# Patient Record
Sex: Male | Born: 1965 | Race: Black or African American | Hispanic: No | Marital: Married | Smoking: Never smoker
Health system: Southern US, Community
[De-identification: ages and names within clinical notes are randomized; demographics above are authoritative.]

## PROBLEM LIST (undated history)

## (undated) DIAGNOSIS — D571 Sickle-cell disease without crisis: Secondary | ICD-10-CM

## (undated) HISTORY — DX: Sickle-cell disease without crisis: D57.1

## (undated) HISTORY — PX: HERNIA REPAIR: SHX51

## (undated) HISTORY — PX: EYE SURGERY: SHX253

---

## 1998-01-11 ENCOUNTER — Emergency Department (HOSPITAL_COMMUNITY): Admission: EM | Admit: 1998-01-11 | Discharge: 1998-01-11 | Payer: Self-pay | Admitting: Emergency Medicine

## 1998-05-03 ENCOUNTER — Encounter: Admission: RE | Admit: 1998-05-03 | Discharge: 1998-08-01 | Payer: Self-pay | Admitting: Pulmonary Disease

## 1999-11-05 ENCOUNTER — Inpatient Hospital Stay (HOSPITAL_COMMUNITY): Admission: EM | Admit: 1999-11-05 | Discharge: 1999-11-07 | Payer: Self-pay | Admitting: Emergency Medicine

## 2000-01-14 ENCOUNTER — Inpatient Hospital Stay (HOSPITAL_COMMUNITY): Admission: EM | Admit: 2000-01-14 | Discharge: 2000-01-16 | Payer: Self-pay | Admitting: Emergency Medicine

## 2001-08-02 ENCOUNTER — Inpatient Hospital Stay (HOSPITAL_COMMUNITY): Admission: EM | Admit: 2001-08-02 | Discharge: 2001-08-03 | Payer: Self-pay | Admitting: Emergency Medicine

## 2003-04-26 ENCOUNTER — Encounter: Payer: Self-pay | Admitting: Emergency Medicine

## 2003-04-26 ENCOUNTER — Inpatient Hospital Stay (HOSPITAL_COMMUNITY): Admission: EM | Admit: 2003-04-26 | Discharge: 2003-04-29 | Payer: Self-pay | Admitting: Emergency Medicine

## 2013-11-08 ENCOUNTER — Telehealth: Payer: Self-pay | Admitting: General Practice

## 2013-11-08 NOTE — Telephone Encounter (Signed)
Called patient for information regarding medical records. All contact numbers are disconnected at this time.

## 2014-01-19 ENCOUNTER — Telehealth: Payer: Self-pay | Admitting: General Practice

## 2014-01-19 NOTE — Telephone Encounter (Signed)
Attempted to contact patient to schedule appointment. All contact numbers listed are disconnected. [facility] with possible alternate numbers.

## 2014-03-22 ENCOUNTER — Telehealth: Payer: Self-pay | Admitting: Hematology & Oncology

## 2014-03-22 NOTE — Telephone Encounter (Signed)
Pt left message cx [phone]. I left him message to call and reschedule

## 2014-03-24 ENCOUNTER — Ambulatory Visit: Payer: Self-pay | Admitting: Hematology & Oncology

## 2014-03-24 ENCOUNTER — Ambulatory Visit: Payer: Self-pay

## 2014-03-24 ENCOUNTER — Other Ambulatory Visit: Payer: Self-pay | Admitting: Lab

## 2014-03-30 ENCOUNTER — Ambulatory Visit (INDEPENDENT_AMBULATORY_CARE_PROVIDER_SITE_OTHER): Payer: Medicaid Other | Admitting: Internal Medicine

## 2014-03-30 ENCOUNTER — Encounter: Payer: Self-pay | Admitting: Internal Medicine

## 2014-03-30 VITALS — BP 127/68 | HR 70 | Temp 98.3°F | Resp 16 | Ht 67.0 in | Wt 216.0 lb

## 2014-03-30 DIAGNOSIS — Z23 Encounter for immunization: Secondary | ICD-10-CM | POA: Insufficient documentation

## 2014-03-30 DIAGNOSIS — Z1211 Encounter for screening for malignant neoplasm of colon: Secondary | ICD-10-CM

## 2014-03-30 DIAGNOSIS — H5440 Blindness, one eye, unspecified eye: Secondary | ICD-10-CM | POA: Insufficient documentation

## 2014-03-30 DIAGNOSIS — Z0000 Encounter for general adult medical examination without abnormal findings: Secondary | ICD-10-CM | POA: Insufficient documentation

## 2014-03-30 DIAGNOSIS — R011 Cardiac murmur, unspecified: Secondary | ICD-10-CM

## 2014-03-30 DIAGNOSIS — D5720 Sickle-cell/Hb-C disease without crisis: Secondary | ICD-10-CM

## 2014-03-30 LAB — MAGNESIUM: Magnesium: 1.9 mg/dL (ref 1.5–2.5)

## 2014-03-30 LAB — LIPID PANEL
CHOLESTEROL: 145 mg/dL (ref 0–200)
HDL: 32 mg/dL — ABNORMAL LOW (ref >39)
LDL CALC: 95 mg/dL (ref 0–99)
Total CHOL/HDL Ratio: 4.5 Ratio
Triglycerides: 89 mg/dL (ref <150)
VLDL: 18 mg/dL (ref 0–40)

## 2014-03-30 MED ORDER — MORPHINE SULFATE 15 MG PO TABS: 15.0000 mg | ORAL_TABLET | Freq: Four times a day (QID) | ORAL | Status: DC | PRN

## 2014-03-30 NOTE — Progress Notes (Signed)
Patient ID: [name], male   DOB: [birth year], 48 y.o.   MRN: [id number]   Grant Jones, is a 48 y.o. male  ZDG:[id number]  [id number]  DOB - 1965-12-20  CC:  Chief Complaint  Patient presents with  . Establish Care       HPI: Grant Jones is a 48 y.o. male with Hb Morris who is here today to establish medical care. He reports Hb Leonard and had previouly had care provided by [facility] but has not been seen by them in 7 years. He has been having increased pain in the BLE's  R>L. He describes the pain as sharp, non-radiating and sometimes throbbing. This is typical of the pain of vaso-occlusive episodes. The pain has an intensity of 5/10 and has been affecting his ability to work lately. He has no other associated symptoms.  Patient has No headache, No chest pain, No abdominal pain - No Nausea, No new weakness tingling or numbness, No Cough - SOB.  Allergies  Allergen Reactions  . Codeine Swelling  . Penicillins    Past Medical History  Diagnosis Date  . Sickle cell anemia    No current outpatient prescriptions on file prior to visit.   No current facility-administered medications on file prior to visit.   Family History  Problem Relation Age of Onset  . Cancer Mother   . Hypertension Mother   . Cancer Father   . Hypertension Father   . Stroke Father   . Arthritis Father    History   Social History  . Marital Status: Married    Spouse Name: N/A    Number of Children: N/A  . Years of Education: N/A   Occupational History  . Not on file.   Social History Main Topics  . Smoking status: Never Smoker   . Smokeless tobacco: Never Used  . Alcohol Use: No  . Drug Use: No  . Sexual Activity: No   Other Topics Concern  . Not on file   Social History Narrative  . No narrative on file    Review of Systems: Constitutional: Negative for fever, chills, diaphoresis, activity change, appetite change and fatigue. HENT: Negative for ear pain, nosebleeds, congestion,  facial swelling, rhinorrhea, neck pain, neck stiffness and ear discharge.  Eyes: Negative for pain, discharge, redness, itching and visual disturbance. Respiratory: Negative for cough, choking, chest tightness, shortness of breath, wheezing and stridor.  Cardiovascular: Negative for chest pain, palpitations and leg swelling. Gastrointestinal: Negative for abdominal distention. Genitourinary: Negative for dysuria, urgency, frequency, hematuria, flank pain, decreased urine volume, difficulty urinating and dyspareunia.  Musculoskeletal: Negative for back pain, joint swelling and gait problem. Neurological: Negative for dizziness, tremors, seizures, syncope, facial asymmetry, speech difficulty, weakness, light-headedness, numbness and headaches.  Hematological: Negative for adenopathy. Does not bruise/bleed easily. Psychiatric/Behavioral: Negative for hallucinations, behavioral problems, confusion, dysphoric mood, decreased concentration and agitation.    Objective:     Filed Vitals:   03/30/14 0941  BP: 127/68  Pulse: 70  Temp: 98.3 F (36.8 C)  Resp: 16    Physical Exam: Constitutional: Patient appears well-developed and well-nourished. No distress. HENT: Normocephalic, atraumatic, External right and left ear normal. Oropharynx is clear and moist.  Eyes: Conjunctivae and EOM are normal. PERRLA, no scleral icterus. Neck: Normal ROM. Neck supple. No JVD. No tracheal deviation. No thyromegaly. CVS: RRR, S1/S2 +, II/VI SEM at base. Non-radiating. No gallops, no carotid bruit.  Pulmonary: Effort and breath sounds normal, no stridor, rhonchi, wheezes, rales.  Abdominal:  Soft. BS +, no distension, tenderness, rebound or guarding.  Musculoskeletal: Normal range of motion. No edema and no tenderness.  Lymphadenopathy: No lymphadenopathy noted, cervical, inguinal or axillary Neuro: Alert. Normal reflexes, muscle tone coordination. No cranial nerve deficit. Skin: Skin is warm and dry. No rash  noted. Not diaphoretic. No erythema. No pallor. Psychiatric: Normal mood and affect. Behavior, judgment, thought content normal.  No results found for this basename: WBC, HGB, HCT, MCV, PLT   No results found for this basename: CREATININE, BUN, NA, K, CL, CO2    No results found for this basename: HGBA1C   Lipid Panel  No results found for this basename: chol, trig, hdl, cholhdl, vldl, ldlcalc       Assessment and plan:   1. Sickle cell disease, type [place], without crisis Pt here to establish care. He has never taken Hydrea but has continued on OTC Folic Acid. He has been essentially opiate niave in the last several years and has used alternative methods of pain control including OTC non-opiate medications, meditation and heat modalities. However, lately the pain has not been controlled with those modalities.   I have discussed the use of Hydrea. I will check labs today and if okay will initiate Hydrea. Continue Folic acid.   Pt has allergy to codeine which includes airway swelling. Will initiate intermittent analgesic therapy with Morphine Sulfate UR.  - Hemoglobinopathy evaluation - CBC with Differential - Comprehensive metabolic panel - Magnesium - Reticulocytes - Urinalysis - 2D Echocardiogram with contrast; Future - Vit D  25 hydroxy (rtn osteoporosis monitoring) - morphine (MSIR) 15 MG tablet; Take 1 tablet (15 mg total) by mouth every 6 (six) hours as needed for severe pain.  Dispense: 60 tablet; Refill: 0  2. Visit for annual health examination - Pt to schedule a visit for Annual Physical Examination as he has not been seen in more than 5 years by a Physician - Lipid panel  3. 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 Need for Tdap vaccination - TDAP given today. Pt unsure if he had a Pneumonia vaccine. He will check and he will receive the vaccine on next  visit if no record of vaccine. - Tdap vaccine greater than or equal to 7yo IM  5. Special screening for malignant neoplasms, colon - Pt age 48 and will be referred for screening colonoscopy - Ambulatory referral to Gastroenterology  6. Heart murmur - Pt has a II/VI SEM at base. Will refer for ECHO. - 2D Echocardiogram with contrast; Future - TSH   Return for Hb [place], Annual Physical, Review of lab data, heart murmur.  The patient was given clear instructions to go to ER or return to medical center if symptoms don't improve, worsen or new problems develop. The patient verbalized understanding. The patient was told to call to get lab results if they haven't heard anything in the next week.     This note has been created with Education officer, environmentalDragon speech recognition software and smart phrase technology. Any transcriptional errors are unintentional.    Tarhonda Hollenberg A., MD [facility] [place], [place] [address] 079 [phone]   03/30/2014, 10:35 AM

## 2014-03-31 LAB — CBC WITH DIFFERENTIAL/PLATELET
BASOS ABS: 0.1 10*3/uL (ref 0.0–0.1)
Basophils Relative: 1 % (ref 0–1)
EOS ABS: 0.1 10*3/uL (ref 0.0–0.7)
EOS PCT: 2 % (ref 0–5)
HCT: 32.6 % — ABNORMAL LOW (ref 39.0–52.0)
HEMOGLOBIN: 10.9 g/dL — AB (ref 13.0–17.0)
LYMPHS PCT: 30 % (ref 12–46)
Lymphs Abs: 1.5 10*3/uL (ref 0.7–4.0)
MCH: 24.7 pg — AB (ref 26.0–34.0)
MCHC: 33.4 g/dL (ref 30.0–36.0)
MCV: 73.9 fL — ABNORMAL LOW (ref 78.0–100.0)
MONO ABS: 0.3 10*3/uL (ref 0.1–1.0)
Monocytes Relative: 5 % (ref 3–12)
NEUTROS ABS: 3.1 10*3/uL (ref 1.7–7.7)
Neutrophils Relative %: 62 % (ref 43–77)
PLATELETS: 144 10*3/uL — AB (ref 150–400)
RBC: 4.41 MIL/uL (ref 4.22–5.81)
RDW: 21.9 % — ABNORMAL HIGH (ref 11.5–15.5)
WBC: 5 10*3/uL (ref 4.0–10.5)

## 2014-03-31 LAB — HEMOGLOBINOPATHY EVALUATION
Hemoglobin Other: 41.7 % — ABNORMAL HIGH
Hgb A2 Quant: 0 % — ABNORMAL LOW (ref 2.2–3.2)
Hgb A: 0 % — ABNORMAL LOW (ref 96.8–97.8)
Hgb F Quant: 0.9 % (ref 0.0–2.0)
Hgb S Quant: 57.4 % — ABNORMAL HIGH

## 2014-03-31 LAB — RETICULOCYTES
ABS Retic: 123.5 10*3/uL (ref 19.0–186.0)
RBC.: 4.41 MIL/uL (ref 4.22–5.81)
RETIC CT PCT: 2.8 % — AB (ref 0.4–2.3)

## 2014-03-31 LAB — URINALYSIS
BILIRUBIN URINE: NEGATIVE
Glucose, UA: NEGATIVE mg/dL
Hgb urine dipstick: NEGATIVE
Leukocytes, UA: NEGATIVE
Nitrite: NEGATIVE
Protein, ur: NEGATIVE mg/dL
Specific Gravity, Urine: 1.013 (ref 1.005–1.030)
Urobilinogen, UA: >8 mg/dL — ABNORMAL HIGH (ref 0.0–1.0)
pH: 7 (ref 5.0–8.0)

## 2014-03-31 LAB — TSH: TSH: 1.999 u[IU]/mL (ref 0.350–4.500)

## 2014-03-31 LAB — VITAMIN D 25 HYDROXY (VIT D DEFICIENCY, FRACTURES): Vit D, 25-Hydroxy: 15 ng/mL — ABNORMAL LOW (ref 30–89)

## 2014-04-03 ENCOUNTER — Encounter: Payer: Self-pay | Admitting: Gastroenterology

## 2014-04-05 LAB — HGB ELECTROPHORESIS REFLEXED REPORT
HEMOGLOBIN ELECT C: 44.8 % — AB
Hemoglobin A - HGBRFX: 0 % — ABNORMAL LOW (ref >96.0)
Hemoglobin A2 - HGBRFX: 4 % — ABNORMAL HIGH (ref 1.8–3.5)
Hemoglobin F - HGBRFX: 0 % (ref <2.0)
Hemoglobin S - HGBRFX: 51.2 % — ABNORMAL HIGH
SICKLE SOLUBILITY TEST - HGBRFX: POSITIVE — AB

## 2014-04-17 ENCOUNTER — Emergency Department (HOSPITAL_COMMUNITY)
Admission: EM | Admit: 2014-04-17 | Discharge: 2014-04-17 | Disposition: A | Discharge status: Home / Self Care | Payer: Medicaid Other | Attending: Emergency Medicine | Admitting: Emergency Medicine

## 2014-04-17 ENCOUNTER — Encounter (HOSPITAL_COMMUNITY): Payer: Self-pay | Admitting: Emergency Medicine

## 2014-04-17 ENCOUNTER — Emergency Department (HOSPITAL_COMMUNITY): Payer: Medicaid Other

## 2014-04-17 DIAGNOSIS — Z791 Long term (current) use of non-steroidal anti-inflammatories (NSAID): Secondary | ICD-10-CM | POA: Diagnosis not present

## 2014-04-17 DIAGNOSIS — Z79899 Other long term (current) drug therapy: Secondary | ICD-10-CM | POA: Diagnosis not present

## 2014-04-17 DIAGNOSIS — K044 Acute apical periodontitis of pulpal origin: Secondary | ICD-10-CM | POA: Diagnosis not present

## 2014-04-17 DIAGNOSIS — Z880 Allergy status to penicillin: Secondary | ICD-10-CM | POA: Diagnosis not present

## 2014-04-17 DIAGNOSIS — K047 Periapical abscess without sinus: Secondary | ICD-10-CM

## 2014-04-17 DIAGNOSIS — D5700 Hb-SS disease with crisis, unspecified: Secondary | ICD-10-CM | POA: Insufficient documentation

## 2014-04-17 LAB — CBC WITH DIFFERENTIAL/PLATELET
BASOS ABS: 0 10*3/uL (ref 0.0–0.1)
Basophils Relative: 0 % (ref 0–1)
EOS ABS: 0.6 10*3/uL (ref 0.0–0.7)
Eosinophils Relative: 5 % (ref 0–5)
HCT: 31 % — ABNORMAL LOW (ref 39.0–52.0)
Hemoglobin: 10.5 g/dL — ABNORMAL LOW (ref 13.0–17.0)
LYMPHS PCT: 15 % (ref 12–46)
Lymphs Abs: 1.8 10*3/uL (ref 0.7–4.0)
MCH: 25 pg — ABNORMAL LOW (ref 26.0–34.0)
MCHC: 33.9 g/dL (ref 30.0–36.0)
MCV: 73.8 fL — ABNORMAL LOW (ref 78.0–100.0)
MONOS PCT: 4 % (ref 3–12)
Monocytes Absolute: 0.5 10*3/uL (ref 0.1–1.0)
Neutro Abs: 9 10*3/uL — ABNORMAL HIGH (ref 1.7–7.7)
Neutrophils Relative %: 76 % (ref 43–77)
PLATELETS: 139 10*3/uL — AB (ref 150–400)
RBC: 4.2 MIL/uL — ABNORMAL LOW (ref 4.22–5.81)
RDW: 22.4 % — AB (ref 11.5–15.5)
WBC: 11.9 10*3/uL — AB (ref 4.0–10.5)

## 2014-04-17 LAB — COMPREHENSIVE METABOLIC PANEL
ALBUMIN: 4 g/dL (ref 3.5–5.2)
ALK PHOS: 76 U/L (ref 39–117)
ALT: 17 U/L (ref 0–53)
ANION GAP: 17 — AB (ref 5–15)
AST: 25 U/L (ref 0–37)
BUN: 4 mg/dL — AB (ref 6–23)
CO2: 20 mEq/L (ref 19–32)
CREATININE: 0.78 mg/dL (ref 0.50–1.35)
Calcium: 9.4 mg/dL (ref 8.4–10.5)
Chloride: 105 mEq/L (ref 96–112)
GFR calc non Af Amer: >90 mL/min (ref >90)
GLUCOSE: 98 mg/dL (ref 70–99)
POTASSIUM: 3.8 meq/L (ref 3.7–5.3)
Sodium: 142 mEq/L (ref 137–147)
Total Bilirubin: 0.5 mg/dL (ref 0.3–1.2)
Total Protein: 8 g/dL (ref 6.0–8.3)

## 2014-04-17 LAB — RETICULOCYTES
RBC.: 4.2 MIL/uL — AB (ref 4.22–5.81)
Retic Count, Absolute: 172.2 10*3/uL (ref 19.0–186.0)
Retic Ct Pct: 4.1 % — ABNORMAL HIGH (ref 0.4–3.1)

## 2014-04-17 MED ORDER — CLINDAMYCIN HCL 300 MG PO CAPS: 300.0000 mg | ORAL_CAPSULE | Freq: Three times a day (TID) | ORAL | Status: DC

## 2014-04-17 MED ORDER — CLINDAMYCIN PHOSPHATE 600 MG/50ML IV SOLN
600.0000 mg | Freq: Once | INTRAVENOUS | Status: AC
  Administered 2014-04-17: 600 mg via INTRAVENOUS
  Filled 2014-04-17: qty 50

## 2014-04-17 MED ORDER — OXYCODONE-ACETAMINOPHEN 7.5-325 MG PO TABS: 1.0000 | ORAL_TABLET | ORAL | Status: DC | PRN

## 2014-04-17 MED ORDER — SODIUM CHLORIDE 0.9 % IV SOLN
1000.0000 mL | Freq: Once | INTRAVENOUS | Status: AC
Start: 2014-04-17 — End: 2014-04-17
  Administered 2014-04-17: 1000 mL via INTRAVENOUS

## 2014-04-17 MED ORDER — ONDANSETRON HCL 4 MG/2ML IJ SOLN
4.0000 mg | INTRAMUSCULAR | Status: DC | PRN
  Administered 2014-04-17: 4 mg via INTRAVENOUS
  Filled 2014-04-17: qty 2

## 2014-04-17 MED ORDER — HYDROMORPHONE HCL PF 1 MG/ML IJ SOLN
1.0000 mg | INTRAMUSCULAR | Status: AC | PRN
  Administered 2014-04-17 (×2): 1 mg via INTRAVENOUS
  Filled 2014-04-17 (×2): qty 1

## 2014-04-17 MED ORDER — DIPHENHYDRAMINE HCL 50 MG/ML IJ SOLN
12.5000 mg | INTRAMUSCULAR | Status: DC | PRN
  Administered 2014-04-17: 12.5 mg via INTRAVENOUS
  Filled 2014-04-17: qty 1

## 2014-04-17 NOTE — ED Notes (Signed)
Pt in [facility], c/o pain in legs, head, and back of neck.

## 2014-04-17 NOTE — Discharge Instructions (Signed)
Dental Abscess A dental abscess is a collection of infected fluid (pus) from a bacterial infection in the inner part of the tooth (pulp). It usually occurs at the end of the tooth's root.  CAUSES   Severe tooth decay.  Trauma to the tooth that allows bacteria to enter into the pulp, such as a broken or chipped tooth. SYMPTOMS   Severe pain in and around the infected tooth.  Swelling and redness around the abscessed tooth or in the mouth or face.  Tenderness.  Pus drainage.  Bad breath.  Bitter taste in the mouth.  Difficulty swallowing.  Difficulty opening the mouth.  Nausea.  Vomiting.  Chills.  Swollen neck glands. DIAGNOSIS   A medical and dental history will be taken.  An examination will be performed by tapping on the abscessed tooth.  X-rays may be taken of the tooth to identify the abscess. TREATMENT The goal of treatment is to eliminate the infection. You may be prescribed antibiotic medicine to stop the infection from spreading. A root canal may be performed to save the tooth. If the tooth cannot be saved, it may be pulled (extracted) and the abscess may be drained.  HOME CARE INSTRUCTIONS  Only take over-the-counter or prescription medicines for pain, fever, or discomfort as directed by your caregiver.  Rinse your mouth (gargle) often with salt water ( tsp salt in 8 oz [250 ml] of warm water) to relieve pain or swelling.  Do not drive after taking pain medicine (narcotics).  Do not apply heat to the outside of your face.  Return to your dentist for further treatment as directed. SEEK MEDICAL CARE IF:  Your pain is not helped by medicine.  Your pain is getting worse instead of better. SEEK IMMEDIATE MEDICAL CARE IF:  You have a fever or persistent symptoms for more than 2-3 days.  You have a fever and your symptoms suddenly get worse.  You have chills or a very bad headache.  You have problems breathing or swallowing.  You have trouble  opening your mouth.  You have swelling in the neck or around the eye. Document Released: 08/18/2005 Document Revised: 05/12/2012 Document Reviewed: 11/26/2010 [facility] Patient Information 2015 [place]. This information is not intended to replace advice given to you by your health care provider. Make sure you discuss any questions you have with your health care provider.  Sickle Cell Anemia Sickle cell anemia is a condition where your red blood cells are shaped like sickles. Red blood cells carry oxygen through the body. Sickle-shaped red blood cells do not live as long as normal red blood cells. They also clump together and block blood from flowing through the blood vessels. These things prevent the body from getting enough oxygen. Sickle cell anemia causes organ damage and pain. It also increases the risk of infection. HOME CARE  Drink enough fluid to keep your pee (urine) clear or pale yellow. Drink more in hot weather and during exercise.  Do not smoke. Smoking lowers oxygen levels in the blood.  Only take over-the-counter or prescription medicines as told by your doctor.  Take antibiotic medicines as told by your doctor. Make sure you finish them even if you start to feel better.  Take supplements as told by your doctor.  Consider wearing a medical alert bracelet. This tells anyone caring for you in an emergency of your condition.  When traveling, keep your medical information, doctors' names, and the medicines you take with you at all times.  If  you have a fever, do not take fever medicines right away. This could cover up a problem. Tell your doctor.   Keep all follow-up visits with your doctor. Sickle cell anemia requires regular medical care. GET HELP IF: You have a fever. GET HELP RIGHT AWAY IF:  You feel dizzy or faint.  You have new belly (abdominal) pain, especially on the left side near the stomach area.  You have a lasting, often uncomfortable and painful  erection of the penis (priapism). If it is not treated right away, you will become unable to have sex (impotence).  You have numbness in your arms or legs or you have a hard time moving them.  You have a hard time talking.  You have a fever or lasting symptoms for more than 2-3 days.  You have a fever and your symptoms suddenly get worse.  You have signs or symptoms of infection. These include:  Chills.  Being more tired than normal (lethargy).  Irritability.  Poor eating.  Throwing up (vomiting).  You have pain that is not helped with medicine.  You have shortness of breath.  You have pain in your chest.  You are coughing up pus-like or bloody mucus.  You have a stiff neck.  Your feet or hands swell or have pain.  Your belly looks bloated.  Your joints hurt. MAKE SURE YOU:  Understand these instructions.  Will watch your condition.  Will get help right away if you are not doing well or get worse. Document Released: 06/08/2013 Document Revised: 01/02/2014 Document Reviewed: 06/08/2013 [facility] Patient Information 2015 [place], [place]. This information is not intended to replace advice given to you by your health care provider. Make sure you discuss any questions you have with your health care provider.

## 2014-04-17 NOTE — ED Notes (Signed)
patient did not want additional pain med when offered.

## 2014-04-17 NOTE — ED Provider Notes (Signed)
CSN: [id number]     Arrival date & time 04/17/14  0910 History   First MD Initiated Contact with Patient 04/17/14 0930     Chief Complaint  Patient presents with  . Sickle Cell Pain Crisis      HPI Patient with known history of sickle cell disease comes in with sickle cell pain in legs back neck arms.  Patient denies fever chills cough nausea or vomiting.  Patient normally takes over-the-counter medicines but they're not working.  Used to go to do for sickle cell now goes to Dr. Ashley RoyaltyMatthews. Past Medical History  Diagnosis Date  . Sickle cell anemia    Past Surgical History  Procedure Laterality Date  . Eye surgery    . Hernia repair     Family History  Problem Relation Age of Onset  . Cancer Mother   . Hypertension Mother   . Cancer Father   . Hypertension Father   . Stroke Father   . Arthritis Father    History  Substance Use Topics  . Smoking status: Never Smoker   . Smokeless tobacco: Never Used  . Alcohol Use: No    Review of Systems  All other systems reviewed and are negative  Allergies  Codeine and Penicillins  Home Medications   Prior to Admission medications   Medication Sig Start Date End Date Taking? Authorizing Provider  acetaminophen (TYLENOL) 500 MG tablet Take 1,000 mg by mouth every 6 (six) hours as needed for moderate pain.   Yes Historical Provider, MD  folic acid (FOLVITE) 800 MCG tablet Take 800 mcg by mouth daily.   Yes Historical Provider, MD  ibuprofen (ADVIL,MOTRIN) 200 MG tablet Take 800 mg by mouth every 6 (six) hours as needed for moderate pain.   Yes Historical Provider, MD  Menthol, Topical Analgesic, (ICY HOT EX) Apply 1 application topically daily.   Yes Historical Provider, MD  clindamycin (CLEOCIN) 300 MG capsule Take 1 capsule (300 mg total) by mouth 3 (three) times daily. 04/17/14   Nelia Shiobert L Sumner Kirchman, MD  morphine (MSIR) 15 MG tablet Take 1 tablet (15 mg total) by mouth every 6 (six) hours as needed for severe pain. 03/30/14   Altha HarmMichelle  A Matthews, MD  oxyCODONE-acetaminophen (PERCOCET) 7.5-325 MG per tablet Take 1 tablet by mouth every 4 (four) hours as needed for pain. 04/17/14   Nelia Shiobert L Sarabi Sockwell, MD   BP 130/64  Pulse 79  Temp(Src) 98.4 F (36.9 C) (Oral)  Resp 16  SpO2 98% Physical Exam Physical Exam  Nursing note and vitals reviewed. Constitutional: He is oriented to person, place, and time. He appears well-developed and well-nourished. No distress.  HENT:  Head: Normocephalic and atraumatic.  Eyes: Pupils are equal, round, and reactive to light.  Neck: Normal range of motion.  Cardiovascular: Normal rate and intact distal pulses.   Pulmonary/Chest: No respiratory distress.  Abdominal: Normal appearance. He exhibits no distension.  Musculoskeletal: Normal range of motion.  Neurological: He is alert and oriented to person, place, and time. No cranial nerve deficit.  Skin: Skin is warm and dry. No rash noted.  Psychiatric: He has a normal mood and affect. His behavior is normal.   ED Course  Procedures (including critical care time)  Medications  diphenhydrAMINE (BENADRYL) injection 12.5 mg (12.5 mg Intravenous Given 04/17/14 1057)  ondansetron (ZOFRAN) injection 4 mg (4 mg Intravenous Given 04/17/14 1055)  0.9 %  sodium chloride infusion (0 mLs Intravenous Stopped 04/17/14 1226)  HYDROmorphone (DILAUDID) injection 1 mg (1  mg Intravenous Given 04/17/14 1225)  clindamycin (CLEOCIN) IVPB 600 mg (0 mg Intravenous Stopped 04/17/14 1222)    Labs Review Labs Reviewed  CBC WITH DIFFERENTIAL - Abnormal; Notable for the following:    WBC 11.9 (*)    RBC 4.20 (*)    Hemoglobin 10.5 (*)    HCT 31.0 (*)    MCV 73.8 (*)    MCH 25.0 (*)    RDW 22.4 (*)    Platelets 139 (*)    Neutro Abs 9.0 (*)    All other components within normal limits  RETICULOCYTES - Abnormal; Notable for the following:    Retic Ct Pct 4.1 (*)    RBC. 4.20 (*)    All other components within normal limits  COMPREHENSIVE METABOLIC PANEL -  Abnormal; Notable for the following:    BUN 4 (*)    Anion gap 17 (*)    All other components within normal limits    Imaging Review Dg Chest 2 View  04/17/2014   CLINICAL DATA:  Sickle cell crisis.  Pain.  EXAM: CHEST  2 VIEW  COMPARISON:  None.  FINDINGS: Low lung volumes. Normal cardiomediastinal silhouette. No infiltrates or failure. No effusion or pneumothorax. Vertebral body morphology and osseous density are consistent with the diagnosis of sickle cell disease.  IMPRESSION: No active infiltrates.   Electronically Signed   By: Davonna Belling M.D.   On: 04/17/2014 10:36      MDM   Final diagnoses:  Sickle cell crisis  Dental infection        Nelia Shi, MD 04/17/14 [phone]

## 2014-05-01 ENCOUNTER — Ambulatory Visit (INDEPENDENT_AMBULATORY_CARE_PROVIDER_SITE_OTHER): Payer: Medicaid Other | Admitting: Family Medicine

## 2014-05-01 ENCOUNTER — Encounter: Payer: Self-pay | Admitting: Family Medicine

## 2014-05-01 VITALS — BP 128/79 | HR 60 | Temp 98.2°F | Resp 16 | Ht 67.0 in | Wt 222.0 lb

## 2014-05-01 DIAGNOSIS — H5440 Blindness, one eye, unspecified eye: Secondary | ICD-10-CM

## 2014-05-01 DIAGNOSIS — Z23 Encounter for immunization: Secondary | ICD-10-CM

## 2014-05-01 DIAGNOSIS — Z0000 Encounter for general adult medical examination without abnormal findings: Secondary | ICD-10-CM

## 2014-05-01 DIAGNOSIS — E559 Vitamin D deficiency, unspecified: Secondary | ICD-10-CM

## 2014-05-01 DIAGNOSIS — D5720 Sickle-cell/Hb-C disease without crisis: Secondary | ICD-10-CM

## 2014-05-01 DIAGNOSIS — Z8042 Family history of malignant neoplasm of prostate: Secondary | ICD-10-CM

## 2014-05-01 MED ORDER — ERGOCALCIFEROL 1.25 MG (50000 UT) PO CAPS: 50000.0000 [IU] | ORAL_CAPSULE | ORAL | Status: AC

## 2014-05-01 NOTE — Patient Instructions (Signed)
Sickle Cell Anemia, Adult °Sickle cell anemia is a condition in which red blood cells have an abnormal "sickle" shape. This abnormal shape shortens the cells' life span, which results in a lower than normal concentration of red blood cells in the blood. The sickle shape also causes the cells to clump together and block free blood flow through the blood vessels. As a result, the tissues and organs of the body do not receive enough oxygen. Sickle cell anemia causes organ damage and pain and increases the risk of infection. °CAUSES  °Sickle cell anemia is a genetic disorder. Those who receive two copies of the gene have the condition, and those who receive one copy have the trait. °RISK FACTORS °The sickle cell gene is most common in people whose families originated in [place]. Other areas of the globe where sickle cell trait occurs include the Mediterranean, [place] and [place], the [place], and the [place].  °SIGNS AND SYMPTOMS °· Pain, especially in the extremities, back, chest, or abdomen (common). The pain may start suddenly or may develop following an illness, especially if there is dehydration. Pain can also occur due to overexertion or exposure to extreme temperature changes. °· Frequent severe bacterial infections, especially certain types of pneumonia and meningitis. °· Pain and swelling in the hands and feet. °· Decreased activity.   °· Loss of appetite.   °· Change in behavior. °· Headaches. °· Seizures. °· Shortness of breath or difficulty breathing. °· Vision changes. °· Skin ulcers. °Those with the trait may not have symptoms or they may have mild symptoms.  °DIAGNOSIS  °Sickle cell anemia is diagnosed with blood tests that demonstrate the genetic trait. It is often diagnosed during the newborn period, due to mandatory testing nationwide. A variety of blood tests, X-rays, CT scans, MRI scans, ultrasounds, and lung function tests may also be done to monitor the condition. °TREATMENT  °Sickle  cell anemia may be treated with: °· Medicines. You may be given pain medicines, antibiotic medicines (to treat and prevent infections) or medicines to increase the production of certain types of hemoglobin. °· Fluids. °· Oxygen. °· Blood transfusions. °HOME CARE INSTRUCTIONS  °· Drink enough fluid to keep your urine clear or pale yellow. Increase your fluid intake in hot weather and during exercise. °· Do not smoke. Smoking lowers oxygen levels in the blood.   °· Only take over-the-counter or prescription medicines for pain, fever, or discomfort as directed by your health care provider. °· Take antibiotics as directed by your health care provider. Make sure you finish them it even if you start to feel better.   °· Take supplements as directed by your health care provider.   °· Consider wearing a medical alert bracelet. This tells anyone caring for you in an emergency of your condition.   °· When traveling, keep your medical information, health care provider's names, and the medicines you take with you at all times.   °· If you develop a fever, do not take medicines to reduce the fever right away. This could cover up a problem that is developing. Notify your health care provider. °· Keep all follow-up appointments with your health care provider. Sickle cell anemia requires regular medical care. °SEEK MEDICAL CARE IF: ° You have a fever. °SEEK IMMEDIATE MEDICAL CARE IF:  °· You feel dizzy or faint.   °· You have new abdominal pain, especially on the left side near the stomach area.   °· You develop a persistent, often uncomfortable and painful penile erection (priapism). If this is not treated immediately it   will lead to impotence.   °· You have numbness your arms or legs or you have a hard time moving them.   °· You have a hard time with speech.   °· You have a fever or persistent symptoms for more than 2-3 days.   °· You have a fever and your symptoms suddenly get worse.   °· You have signs or symptoms of infection.  These include:   °¨ Chills.   °¨ Abnormal tiredness (lethargy).   °¨ Irritability.   °¨ Poor eating.   °¨ Vomiting.   °· You develop pain that is not helped with medicine.   °· You develop shortness of breath. °· You have pain in your chest.   °· You are coughing up pus-like or bloody sputum.   °· You develop a stiff neck. °· Your feet or hands swell or have pain. °· Your abdomen appears bloated. °· You develop joint pain. °MAKE SURE YOU: °· Understand these instructions. °Document Released: 11/26/2005 Document Revised: 01/02/2014 Document Reviewed: 03/30/2013 °Exit[employer]® Patient Information ©2015 [employer]. This information is not intended to replace advice given to you by your health care provider. Make sure you discuss any questions you have with your health care provider. ° °

## 2014-05-01 NOTE — Progress Notes (Signed)
   Subjective:    Patient ID: Grant Jones, male    DOB: 03-25-1966, 48 y.o.   MRN: [id number]  HPI Patient presents for his annual physical examination. He reports that he feels well and does not have any current complaints. Patient states that he is not taking any dietary substances that are not prescribed by a physician. 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     Past Medical History  Diagnosis Date  . Sickle cell anemia     Review of Systems  Constitutional: Negative.   HENT: Negative.   Eyes: Negative.   Respiratory: Negative.   Cardiovascular: Negative.   Gastrointestinal: Negative.   Endocrine: Negative.   Genitourinary: Negative.   Musculoskeletal: Negative.   Skin: Negative.   Allergic/Immunologic: Negative.   Neurological: Negative.   Hematological: Negative.   Psychiatric/Behavioral: Negative.        Objective:   Physical Exam  Vitals reviewed. Constitutional: He is oriented to person, place, and time. He appears well-developed and well-nourished.  HENT:  Head: Normocephalic.  Right Ear: Hearing, tympanic membrane, external ear and ear canal normal.  Left Ear: Hearing, tympanic membrane, external ear and ear canal normal.  Nose: Nose normal.  Mouth/Throat: Oropharynx is clear and moist. Abnormal dentition. Dental caries present.  Eyes: Conjunctivae and lids are normal. Pupils are equal, round, and reactive to light.    Neck: Normal range of motion. Neck supple.  Cardiovascular: Normal rate, regular rhythm, normal heart sounds and intact distal pulses.   Pulmonary/Chest: Effort normal and breath sounds normal.  Abdominal: Soft. Bowel sounds are normal.  Musculoskeletal: Normal range of motion.  Neurological: He is alert and oriented to person, place, and time. He has normal reflexes.  Skin: Skin is warm and dry.  Psychiatric: He has a normal mood and affect. His behavior is normal. Judgment and thought  content normal.      BP 128/79  Pulse 60  Temp(Src) 98.2 F (36.8 C) (Oral)  Resp 16  Ht  (1.702 m)  Wt 222 lb (100.699 kg)  BMI 34.76 kg/m2  SpO2 99%    Assessment & Plan:  1. 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 Continue folic acid 1 mg daily to prevent aplastic bone marrow crises . States that he takes Ibuprofen for pain as needed. He reports that he drinks a lot of water but does not eat a balanced diet. He reports that he often eats 1 large meal. Recommend that patient eat smaller, balanced meals.  - Ambulatory referral to Ophthalmology  2. Visit for annual health examination -Prostates and occult blood negative.  -Reviewed annual physical lab values. Vitamin D deficiency  - POC Hemoccult Bld/Stl (1-Cd Office Dx)  3. Blindness of left eye -Opthalmology referral, he states that it has been several years since last eye exam.   4. Immunization due  - Flu Vaccine QUAD 36+ mos PF IM (Fluarix Quad PF) - Pneumococcal polysaccharide vaccine 23-valent greater than or equal to 2yo subcutaneous/IM  5. Unspecified vitamin D deficiency - ergocalciferol (VITAMIN D2) 50000 UNITS capsule; Take 1 capsule (50,000 Units total) by mouth once a week.  Dispense: 4 capsule; Refill: 4  6. Family history of prostate cancer in father Check prostate manually. Will also check PSA level.  - PSA   RTC: 3 months for follow up of SCD    Dessiree Sze M, FNP

## 2014-05-02 LAB — PSA: PSA: 1.74 ng/mL (ref <=4.00)

## 2014-06-07 ENCOUNTER — Encounter: Payer: Self-pay | Admitting: Gastroenterology

## 2014-07-20 ENCOUNTER — Non-Acute Institutional Stay (HOSPITAL_COMMUNITY)
Admission: AD | Admit: 2014-07-20 | Discharge: 2014-07-20 | Disposition: A | Discharge status: Home / Self Care | Payer: Medicaid Other | Attending: Internal Medicine | Admitting: Internal Medicine

## 2014-07-20 ENCOUNTER — Telehealth (HOSPITAL_COMMUNITY): Payer: Self-pay | Admitting: *Deleted

## 2014-07-20 ENCOUNTER — Encounter (HOSPITAL_COMMUNITY): Payer: Self-pay | Admitting: *Deleted

## 2014-07-20 DIAGNOSIS — H5440 Blindness, one eye, unspecified eye: Secondary | ICD-10-CM | POA: Diagnosis present

## 2014-07-20 DIAGNOSIS — Z79899 Other long term (current) drug therapy: Secondary | ICD-10-CM | POA: Diagnosis not present

## 2014-07-20 DIAGNOSIS — D57819 Other sickle-cell disorders with crisis, unspecified: Secondary | ICD-10-CM | POA: Insufficient documentation

## 2014-07-20 DIAGNOSIS — Z880 Allergy status to penicillin: Secondary | ICD-10-CM | POA: Insufficient documentation

## 2014-07-20 DIAGNOSIS — D5720 Sickle-cell/Hb-C disease without crisis: Secondary | ICD-10-CM | POA: Diagnosis present

## 2014-07-20 DIAGNOSIS — Z885 Allergy status to narcotic agent status: Secondary | ICD-10-CM | POA: Insufficient documentation

## 2014-07-20 DIAGNOSIS — E860 Dehydration: Secondary | ICD-10-CM | POA: Insufficient documentation

## 2014-07-20 DIAGNOSIS — D5700 Hb-SS disease with crisis, unspecified: Secondary | ICD-10-CM | POA: Diagnosis present

## 2014-07-20 DIAGNOSIS — H5442 Blindness, left eye, normal vision right eye: Secondary | ICD-10-CM

## 2014-07-20 LAB — CBC WITH DIFFERENTIAL/PLATELET
BASOS PCT: 1 % (ref 0–1)
Basophils Absolute: 0 10*3/uL (ref 0.0–0.1)
EOS PCT: 6 % — AB (ref 0–5)
Eosinophils Absolute: 0.3 10*3/uL (ref 0.0–0.7)
HEMATOCRIT: 27.8 % — AB (ref 39.0–52.0)
HEMOGLOBIN: 9.7 g/dL — AB (ref 13.0–17.0)
Lymphocytes Relative: 33 % (ref 12–46)
Lymphs Abs: 1.6 10*3/uL (ref 0.7–4.0)
MCH: 26.1 pg (ref 26.0–34.0)
MCHC: 34.9 g/dL (ref 30.0–36.0)
MCV: 74.9 fL — ABNORMAL LOW (ref 78.0–100.0)
Monocytes Absolute: 0.2 10*3/uL (ref 0.1–1.0)
Monocytes Relative: 4 % (ref 3–12)
NEUTROS PCT: 56 % (ref 43–77)
Neutro Abs: 2.7 10*3/uL (ref 1.7–7.7)
Platelets: 114 10*3/uL — ABNORMAL LOW (ref 150–400)
RBC: 3.71 MIL/uL — AB (ref 4.22–5.81)
RDW: 21.9 % — ABNORMAL HIGH (ref 11.5–15.5)
WBC: 4.8 10*3/uL (ref 4.0–10.5)

## 2014-07-20 LAB — COMPREHENSIVE METABOLIC PANEL
ALBUMIN: 3.8 g/dL (ref 3.5–5.2)
ALT: 11 U/L (ref 0–53)
AST: 15 U/L (ref 0–37)
Alkaline Phosphatase: 76 U/L (ref 39–117)
Anion gap: 14 (ref 5–15)
BUN: 4 mg/dL — ABNORMAL LOW (ref 6–23)
CALCIUM: 8.9 mg/dL (ref 8.4–10.5)
CO2: 24 mEq/L (ref 19–32)
Chloride: 104 mEq/L (ref 96–112)
Creatinine, Ser: 0.79 mg/dL (ref 0.50–1.35)
GFR calc non Af Amer: >90 mL/min (ref >90)
Glucose, Bld: 92 mg/dL (ref 70–99)
Potassium: 3.4 mEq/L — ABNORMAL LOW (ref 3.7–5.3)
Sodium: 142 mEq/L (ref 137–147)
Total Bilirubin: 1.2 mg/dL (ref 0.3–1.2)
Total Protein: 7.3 g/dL (ref 6.0–8.3)

## 2014-07-20 LAB — RETICULOCYTES
RBC.: 3.71 MIL/uL — ABNORMAL LOW (ref 4.22–5.81)
RETIC COUNT ABSOLUTE: 144.7 10*3/uL (ref 19.0–186.0)
Retic Ct Pct: 3.9 % — ABNORMAL HIGH (ref 0.4–3.1)

## 2014-07-20 MED ORDER — ONDANSETRON HCL 4 MG PO TABS: 4.0000 mg | ORAL_TABLET | ORAL | Status: DC | PRN

## 2014-07-20 MED ORDER — SENNOSIDES-DOCUSATE SODIUM 8.6-50 MG PO TABS: 1.0000 | ORAL_TABLET | Freq: Two times a day (BID) | ORAL | Status: DC

## 2014-07-20 MED ORDER — DIPHENHYDRAMINE HCL 50 MG/ML IJ SOLN: 25.0000 mg | INTRAMUSCULAR | Status: DC | PRN

## 2014-07-20 MED ORDER — DEXTROSE-NACL 5-0.45 % IV SOLN
INTRAVENOUS | Status: DC
  Administered 2014-07-20: 11:00:00 via INTRAVENOUS

## 2014-07-20 MED ORDER — OXYCODONE-ACETAMINOPHEN 5-325 MG PO TABS: 1.0000 | ORAL_TABLET | Freq: Four times a day (QID) | ORAL | Status: DC | PRN

## 2014-07-20 MED ORDER — POLYETHYLENE GLYCOL 3350 17 G PO PACK
17.0000 g | PACK | Freq: Every day | ORAL | Status: DC | PRN
  Filled 2014-07-20: qty 1

## 2014-07-20 MED ORDER — SODIUM CHLORIDE 0.9 % IJ SOLN: 9.0000 mL | INTRAMUSCULAR | Status: DC | PRN

## 2014-07-20 MED ORDER — FOLIC ACID 1 MG PO TABS
1.0000 mg | ORAL_TABLET | Freq: Every day | ORAL | Status: DC
  Administered 2014-07-20: 1 mg via ORAL

## 2014-07-20 MED ORDER — NALOXONE HCL 0.4 MG/ML IJ SOLN: 0.4000 mg | INTRAMUSCULAR | Status: DC | PRN

## 2014-07-20 MED ORDER — DIPHENHYDRAMINE HCL 25 MG PO CAPS: 25.0000 mg | ORAL_CAPSULE | ORAL | Status: DC | PRN

## 2014-07-20 MED ORDER — HYDROMORPHONE 2 MG/ML HIGH CONCENTRATION IV PCA SOLN
INTRAVENOUS | Status: DC
  Administered 2014-07-20: 11:00:00 via INTRAVENOUS

## 2014-07-20 MED ORDER — OXYCODONE-ACETAMINOPHEN 5-325 MG PO TABS
1.0000 | ORAL_TABLET | Freq: Once | ORAL | Status: AC
  Administered 2014-07-20: 1 via ORAL
  Filled 2014-07-20: qty 1

## 2014-07-20 MED ORDER — ONDANSETRON HCL 4 MG/2ML IJ SOLN: 4.0000 mg | INTRAMUSCULAR | Status: DC | PRN

## 2014-07-20 MED ORDER — KETOROLAC TROMETHAMINE 30 MG/ML IJ SOLN
30.0000 mg | Freq: Four times a day (QID) | INTRAMUSCULAR | Status: DC
  Administered 2014-07-20 (×2): 30 mg via INTRAVENOUS
  Filled 2014-07-20: qty 1

## 2014-07-20 NOTE — Discharge Summary (Signed)
Physician Discharge Summary  Grant Jones KGM:[id number] DOB: [birth year] DOA: 07/20/2014  PCP: No primary care provider on file.  Admit date: 07/20/2014 Discharge date: 07/20/2014  Discharge Diagnoses:  Active Problems:   Sickle cell disease, type [place]   Blindness of left eye   Sickle cell disease with crisis   Discharge Condition: Stable  Disposition: Home accompanied by cousin  Diet:Regular Wt Readings from Last 3 Encounters:  05/01/14 222 lb (100.699 kg)  03/30/14 216 lb (97.977 kg)    Hospital Course:  [name] was admitted to the day infusion center for extended observation. His treatment plan was outlined by Dr. Mikeal HawthorneGarba. He was started on hypotonic IV fluids for cellular rehydration and Toradol every 6 hours for inflammation. He was started on a high concentration PCA for pain control. Patient is opiate naive, he rarely takes opiate pain medications for pain control. He primarily takes Ibuprofen. He used a total of 3 mg with 5 demands and 5 deliveries. PCA was discontinued due to respiration decrease to 7. Respirations have been averaging from 7-25. His pain intensity decreased to 4/10, he states that he can function at home. He was given an Rx of Percocet 5-325 mg every 6 hours for moderate to severe pain #20. He was previously taking Percocet 7.5-325 mg every 4 hours, he states that it has been months since he has taken that dosage. Also, he did not fill his last prescription. We also discussed the importance of daily folic acid and 64 ounces of water per day.  Patient can continue Ibuprofen as directed. He is scheduled to follow up in the clinic as scheduled.   Discharge Exam:  Filed Vitals:   07/20/14 1455  BP: 128/78  Pulse: 62  Temp:   Resp: 25   Filed Vitals:   07/20/14 1343 07/20/14 1344 07/20/14 1348 07/20/14 1455  BP:   104/61 128/78  Pulse:   55 62  Temp:   97.9 F (36.6 C)   TempSrc:   Oral   Resp: 20 16 12 25   SpO2: 73% 100% 100% 94%   General:  Alert, awake, oriented x3, in no acute distress.  Neck: Trachea midline,  no masses, no thyromegal,y no JVD, no carotid bruit OROPHARYNX:  Moist, No exudate/ erythema/lesions.  Heart: Regular rate and rhythm, without murmurs, rubs, gallops, PMI non-displaced, no heaves or thrills on palpation.  Lungs: Clear to auscultation, no wheezing or rhonchi noted. No increased vocal fremitus resonant to percussion  Abdomen: Soft, nontender, nondistended, positive bowel sounds, no masses no hepatosplenomegaly noted..  Neuro: No focal neurological deficits noted cranial nerves II through XII grossly intact. DTRs 2+ bilaterally upper and lower extremities. Strength 5 out of 5 in bilateral upper and lower extremities. Musculoskeletal: No warm swelling or erythema around joints, no spinal tenderness noted. Psychiatric: Patient alert and oriented x3, good insight and cognition, good recent to remote recall. Lymph node survey: No cervical axillary or inguinal lymphadenopathy noted.   Discharge Instructions     Medication List    STOP taking these medications        oxyCODONE-acetaminophen 7.5-325 MG per tablet  Commonly known as:  PERCOCET  Replaced by:  oxyCODONE-acetaminophen 5-325 MG per tablet      TAKE these medications        acetaminophen 500 MG tablet  Commonly known as:  TYLENOL  Take 1,000 mg by mouth every 6 (six) hours as needed for moderate pain.     ergocalciferol 50000 UNITS capsule  Commonly known  as:  VITAMIN D2  Take 1 capsule (50,000 Units total) by mouth once a week.     folic acid 800 MCG tablet  Commonly known as:  FOLVITE  Take 800 mcg by mouth daily.     ibuprofen 200 MG tablet  Commonly known as:  ADVIL,MOTRIN  Take 800 mg by mouth every 6 (six) hours as needed for moderate pain.     ICY HOT EX  Apply 1 application topically daily.     oxyCODONE-acetaminophen 5-325 MG per tablet  Commonly known as:  PERCOCET  Take 1 tablet by mouth every 6 (six) hours as needed  for severe pain.          The results of significant diagnostics from this hospitalization (including imaging, microbiology, ancillary and laboratory) are listed below for reference.    Significant Diagnostic Studies: No results found.  Microbiology: No results found for this or any previous visit (from the past 240 hour(s)).   Labs: Basic Metabolic Panel:  Recent Labs Lab 07/20/14 1100  NA 142  K 3.4*  CL 104  CO2 24  GLUCOSE 92  BUN 4*  CREATININE 0.79  CALCIUM 8.9   Liver Function Tests:  Recent Labs Lab 07/20/14 1100  AST 15  ALT 11  ALKPHOS 76  BILITOT 1.2  PROT 7.3  ALBUMIN 3.8   No results for input(s): LIPASE, AMYLASE in the last 168 hours. No results for input(s): AMMONIA in the last 168 hours. CBC:  Recent Labs Lab 07/20/14 1100  WBC 4.8  NEUTROABS 2.7  HGB 9.7*  HCT 27.8*  MCV 74.9*  PLT 114*   Cardiac Enzymes: No results for input(s): CKTOTAL, CKMB, CKMBINDEX, TROPONINI in the last 168 hours. BNP: Invalid input(s): POCBNP CBG: No results for input(s): GLUCAP in the last 168 hours. Ferritin: No results for input(s): FERRITIN in the last 168 hours.  Time coordinating discharge: 45 minutes  Signed:  Johndaniel Catlin M  07/20/2014, 2:59 PM

## 2014-07-20 NOTE — H&P (Signed)
Grant Jones is an 48 y.o. male.   Chief Complaint: Pain in back and legs HPI: [name] was known history of sickle cell [place] disease who [facility] complaining of 10 out of 10 pain in his legs especially around the knees. Pain is sharp worsening was movement and not relieved by anything including rest. Patient has not filled any prescription that was given to him for pain. He is therefore most 2-D nave to opiates. He denied any injury to the knees. Patient believes this is his sickle cell crisis. He denied any fever, no cough no shortness of breath no chest pain. Pain is persistent for the last 2 days.  Past Medical History  Diagnosis Date  . Sickle cell anemia     Past Surgical History  Procedure Laterality Date  . Eye surgery    . Hernia repair      Family History  Problem Relation Age of Onset  . Cancer Mother   . Hypertension Mother   . Cancer Father   . Hypertension Father   . Stroke Father   . Arthritis Father    Social History:  reports that he has never smoked. He has never used smokeless tobacco. He reports that he does not drink alcohol or use illicit drugs.  Allergies:  Allergies  Allergen Reactions  . Codeine Swelling  . Penicillins Other (See Comments)    Childhood allergy    Medications Prior to Admission  Medication Sig Dispense Refill  . acetaminophen (TYLENOL) 500 MG tablet Take 1,000 mg by mouth every 6 (six) hours as needed for moderate pain.    . ergocalciferol (VITAMIN D2) 50000 UNITS capsule Take 1 capsule (50,000 Units total) by mouth once a week. 4 capsule 4  . folic acid (FOLVITE) 800 MCG tablet Take 800 mcg by mouth daily.    Marland Kitchen. ibuprofen (ADVIL,MOTRIN) 200 MG tablet Take 800 mg by mouth every 6 (six) hours as needed for moderate pain.    . Menthol, Topical Analgesic, (ICY HOT EX) Apply 1 application topically daily.    Marland Kitchen. oxyCODONE-acetaminophen (PERCOCET) 7.5-325 MG per tablet Take 1 tablet by mouth  every 4 (four) hours as needed for pain. 30 tablet 0    No results found for this or any previous visit (from the past 48 hour(s)). No results found.  Review of Systems  Constitutional: Negative.   HENT: Negative.   Eyes: Positive for blurred vision.       Blind  Respiratory: Negative.   Cardiovascular: Negative.   Gastrointestinal: Negative.   Genitourinary: Negative.   Musculoskeletal: Positive for myalgias, back pain and joint pain.  Skin: Negative.   Neurological: Negative.   Endo/Heme/Allergies: Negative.   Psychiatric/Behavioral: Negative.     Blood pressure 136/73, pulse 80, temperature 99.1 F (37.3 C), temperature source Oral, resp. rate 18, SpO2 100 %. Physical Exam  Constitutional: He appears well-developed and well-nourished.  HENT:  Head: Normocephalic and atraumatic.  Right Ear: External ear normal.  Left Ear: External ear normal.  Mouth/Throat: Oropharynx is clear and moist.  Eyes: Conjunctivae and EOM are normal. Pupils are equal, round, and reactive to light.  Neck: Normal range of motion. Neck supple.  Cardiovascular: Normal rate, regular rhythm, normal heart sounds and intact distal pulses.   Respiratory: Effort normal and breath sounds normal.  GI: Soft. Bowel sounds are normal.  Musculoskeletal: Normal range of motion. He exhibits tenderness.  Neurological: He is alert. He has normal reflexes.  Skin:  Skin is warm and dry.  Psychiatric: He has a normal mood and affect.     Assessment/Plan A 48 YO with sickle cell [place] disease admitted with sickle cell painful crisis.  #1 sickle cell painful crisis: Patient will be admitted to the sickle cell pain center. He will receive IV Dilaudid low-dose on Dilaudid PCA pump was carefully monitored since he is opiates nave. We will given some Toradol and what his response. He'll be counselled on taking his home medications.  #2 sickle cell anemia: His hemoglobin seems stable. We will continue to monitor him through  today.  #3 dehydration: Patient will be hydrated aggressively.  GARBA,LAWAL 07/20/2014, 10:40 AM

## 2014-07-20 NOTE — Progress Notes (Signed)
Reviewed pt allergy with Dr.Garba; ok to give ocycodone; Shekia Kuper, Garnette GunnerIrina,RN

## 2014-07-20 NOTE — Progress Notes (Signed)
Notified md of respiration at 7. md ordered stop pca. Entering additional orders.

## 2014-07-20 NOTE — Progress Notes (Signed)
Pt arrived to a day center with pain 10/10 in "back and down to legs" throbbing, aching pain; A/O; VSS; Dilaudid PCA and fluids started per order; pain goal at 5;  pt resting comfortably in bed; post procedure Pt's pain is at 4; Pt a/o; uses insensive spirometer to promote oxygenation; RR 16; O2 100 on room air; CO2 30; HR 56; eyes open; follows commands mood appropriate; Will have family member assist to get home; [place], Garnette GunnerIrina,RN

## 2014-07-20 NOTE — Telephone Encounter (Unsigned)
Patient complaining of leg pain for several days - pain is currently 10 out 10. No other complications. Informed I would notify MD.

## 2014-07-20 NOTE — Telephone Encounter (Unsigned)
Patient informed that he may visit [facility].

## 2014-08-10 ENCOUNTER — Ambulatory Visit: Payer: Medicaid Other | Admitting: Family Medicine

## 2014-09-07 ENCOUNTER — Ambulatory Visit: Payer: Medicaid Other | Admitting: Family Medicine

## 2015-05-23 ENCOUNTER — Encounter (HOSPITAL_COMMUNITY): Payer: Self-pay

## 2015-05-23 ENCOUNTER — Emergency Department (HOSPITAL_COMMUNITY)
Admission: EM | Admit: 2015-05-23 | Discharge: 2015-05-23 | Disposition: A | Discharge status: Home / Self Care | Payer: Medicaid Other | Attending: Emergency Medicine | Admitting: Emergency Medicine

## 2015-05-23 DIAGNOSIS — Z7982 Long term (current) use of aspirin: Secondary | ICD-10-CM | POA: Diagnosis not present

## 2015-05-23 DIAGNOSIS — Z79899 Other long term (current) drug therapy: Secondary | ICD-10-CM | POA: Insufficient documentation

## 2015-05-23 DIAGNOSIS — Z880 Allergy status to penicillin: Secondary | ICD-10-CM | POA: Diagnosis not present

## 2015-05-23 DIAGNOSIS — D5700 Hb-SS disease with crisis, unspecified: Secondary | ICD-10-CM

## 2015-05-23 LAB — CBC WITH DIFFERENTIAL/PLATELET
BASOS ABS: 0.1 10*3/uL (ref 0.0–0.1)
Basophils Relative: 1 %
EOS ABS: 0.2 10*3/uL (ref 0.0–0.7)
Eosinophils Relative: 3 %
HCT: 32.6 % — ABNORMAL LOW (ref 39.0–52.0)
Hemoglobin: 11.4 g/dL — ABNORMAL LOW (ref 13.0–17.0)
LYMPHS PCT: 33 %
Lymphs Abs: 2.5 10*3/uL (ref 0.7–4.0)
MCH: 26.6 pg (ref 26.0–34.0)
MCHC: 35 g/dL (ref 30.0–36.0)
MCV: 76 fL — ABNORMAL LOW (ref 78.0–100.0)
MONOS PCT: 4 %
Monocytes Absolute: 0.3 10*3/uL (ref 0.1–1.0)
NEUTROS ABS: 4.4 10*3/uL (ref 1.7–7.7)
NEUTROS PCT: 59 %
PLATELETS: 159 10*3/uL (ref 150–400)
RBC: 4.29 MIL/uL (ref 4.22–5.81)
RDW: 21.4 % — ABNORMAL HIGH (ref 11.5–15.5)
WBC: 7.5 10*3/uL (ref 4.0–10.5)

## 2015-05-23 LAB — RETICULOCYTES
RBC.: 4.32 MIL/uL (ref 4.22–5.81)
RETIC CT PCT: 3.8 % — AB (ref 0.4–3.1)
Retic Count, Absolute: 164.2 10*3/uL (ref 19.0–186.0)

## 2015-05-23 LAB — COMPREHENSIVE METABOLIC PANEL
ALK PHOS: 95 U/L (ref 38–126)
ALT: 21 U/L (ref 17–63)
AST: 27 U/L (ref 15–41)
Albumin: 4.8 g/dL (ref 3.5–5.0)
Anion gap: 11 (ref 5–15)
BILIRUBIN TOTAL: 1 mg/dL (ref 0.3–1.2)
BUN: 9 mg/dL (ref 6–20)
CALCIUM: 9.6 mg/dL (ref 8.9–10.3)
CO2: 23 mmol/L (ref 22–32)
Chloride: 108 mmol/L (ref 101–111)
Creatinine, Ser: 0.85 mg/dL (ref 0.61–1.24)
GLUCOSE: 90 mg/dL (ref 65–99)
POTASSIUM: 3.8 mmol/L (ref 3.5–5.1)
Sodium: 142 mmol/L (ref 135–145)
TOTAL PROTEIN: 8.8 g/dL — AB (ref 6.5–8.1)

## 2015-05-23 MED ORDER — OXYCODONE-ACETAMINOPHEN 5-325 MG PO TABS
1.0000 | ORAL_TABLET | ORAL | Status: AC | PRN
Start: 2015-05-23 — End: ?

## 2015-05-23 MED ORDER — HYDROMORPHONE HCL 1 MG/ML IJ SOLN
1.0000 mg | Freq: Once | INTRAMUSCULAR | Status: AC
Start: 2015-05-23 — End: 2015-05-23
  Administered 2015-05-23: 1 mg via INTRAVENOUS
  Filled 2015-05-23: qty 1

## 2015-05-23 MED ORDER — SODIUM CHLORIDE 0.9 % IV BOLUS (SEPSIS)
1000.0000 mL | Freq: Once | INTRAVENOUS | Status: AC
  Administered 2015-05-23: 1000 mL via INTRAVENOUS

## 2015-05-23 MED ORDER — HYDROMORPHONE HCL 1 MG/ML IJ SOLN: 1.0000 mg | Freq: Once | INTRAMUSCULAR | Status: DC

## 2015-05-23 NOTE — Progress Notes (Addendum)
1523 C M spoke with Jacki Cones at [facility] [phone] to confirm pt no longer seen by Dr Ashley Royalty at the clinic Pt last seen in the clinic a yr ago but is still an active pt and can be seen by the new provider Dr Hyman Hopes if pt wishes to be established as a new pt.. Cm informed pt of this and he agrees to contact the clinic using the information provided by CM in d/c instructions     1253 CM attempted to speak with staff at sickle cell clinic but office closed for lunch, re open at 1300 1249 CM noted Sickle cell patient Cm spoke with pt He states he had been followed by Dr Deno Etienne but is presently looking for another provider.  Pt confirms he has been connected with [facility] and has their business card in his wallet.  Pt reports feeling some better He confirms IVFs started, labs obtained and he has already been seen by EDP at this time

## 2015-05-23 NOTE — ED Provider Notes (Signed)
CSN: [id number]     Arrival date & time 05/23/15  1008 History   First MD Initiated Contact with Patient 05/23/15 1211     Chief Complaint  Patient presents with  . Sickle Cell Pain Crisis     (Consider location/radiation/quality/duration/timing/severity/associated sxs/prior Treatment) HPI Comments: Pt comes in with cc of sickle cell pain crisis. Pt reports that he has been having off and on pain the past week. Pt states that whilst at work today, he started having pain all over his body (everywhere except the head). Pain is intense and severe and is throbbing and sharp. Last crisis was a year ago, he has never been hospitalized. No fevers. Pt is unsure of a specific trigger, but he thinks it might be work related stress.  Patient is a 49 y.o. male presenting with sickle cell pain. The history is provided by the patient.  Sickle Cell Pain Crisis Associated symptoms: no chest pain, no cough and no shortness of breath     Past Medical History  Diagnosis Date  . Sickle cell anemia    Past Surgical History  Procedure Laterality Date  . Eye surgery    . Hernia repair     Family History  Problem Relation Age of Onset  . Cancer Mother   . Hypertension Mother   . Cancer Father   . Hypertension Father   . Stroke Father   . Arthritis Father    Social History  Substance Use Topics  . Smoking status: Never Smoker   . Smokeless tobacco: Never Used  . Alcohol Use: No    Review of Systems  Constitutional: Negative for activity change and appetite change.  Respiratory: Negative for cough and shortness of breath.   Cardiovascular: Negative for chest pain.  Gastrointestinal: Negative for abdominal pain.  Genitourinary: Negative for dysuria.  Musculoskeletal: Positive for myalgias and arthralgias.  Skin: Negative for rash.      Allergies  Codeine and Penicillins  Home Medications   Prior to Admission medications   Medication Sig Start Date End Date Taking? Authorizing  Grant Jones  acetaminophen (TYLENOL) 500 MG tablet Take 1,000 mg by mouth every 6 (six) hours as needed for moderate pain.   Yes Historical Athalene Kolle, MD  aspirin 325 MG tablet Take 650 mg by mouth every 8 (eight) hours as needed for mild pain, moderate pain or headache.   Yes Historical Marshaun Lortie, MD  cholecalciferol (VITAMIN D) 1000 UNITS tablet Take 1,000 Units by mouth daily.   Yes Historical Renaye Janicki, MD  Fexofenadine HCl (ALLERGY 24-HR PO) Take 1 tablet by mouth daily as needed (allergies).   Yes Historical Demeshia Sherburne, MD  folic acid (FOLVITE) 800 MCG tablet Take 800 mcg by mouth daily.   Yes Historical Rya Rausch, MD  ibuprofen (ADVIL,MOTRIN) 200 MG tablet Take 800 mg by mouth every 6 (six) hours as needed for moderate pain.   Yes Historical Brynlea Spindler, MD  Menthol, Topical Analgesic, (ICY HOT EX) Apply 1 application topically daily as needed (pain).    Yes Historical Glynna Failla, MD  ergocalciferol (VITAMIN D2) 50000 UNITS capsule Take 1 capsule (50,000 Units total) by mouth once a week. Patient not taking: Reported on 05/23/2015 05/01/14   Massie Maroon, FNP  oxyCODONE-acetaminophen (PERCOCET/ROXICET) 5-325 MG per tablet Take 1 tablet by mouth every 4 (four) hours as needed for severe pain. 05/23/15   Ankit Rhunette Croft, MD   BP 114/63 mmHg  Pulse 78  Temp(Src) 98.4 F (36.9 C) (Oral)  Resp 16  Ht  (1.702 m)  Wt 215 lb 8 oz (97.75 kg)  BMI 33.74 kg/m2  SpO2 98% Physical Exam  Constitutional: He is oriented to person, place, and time. He appears well-developed.  HENT:  Head: Atraumatic.  Neck: Neck supple.  Cardiovascular: Normal rate.   Pulmonary/Chest: Effort normal.  Neurological: He is alert and oriented to person, place, and time.  Skin: Skin is warm.  Nursing note and vitals reviewed.   ED Course  Procedures (including critical care time) Labs Review Labs Reviewed  COMPREHENSIVE METABOLIC PANEL - Abnormal; Notable for the following:    Total Protein 8.8 (*)    All other  components within normal limits  CBC WITH DIFFERENTIAL/PLATELET - Abnormal; Notable for the following:    Hemoglobin 11.4 (*)    HCT 32.6 (*)    MCV 76.0 (*)    RDW 21.4 (*)    All other components within normal limits  RETICULOCYTES - Abnormal; Notable for the following:    Retic Ct Pct 3.8 (*)    All other components within normal limits    Imaging Review No results found. I have personally reviewed and evaluated these images and lab results as part of my medical decision-making.   EKG Interpretation None      MDM   Final diagnoses:  Sickle cell anemia with pain    PT comes in with sickle cell pain. Pain improved with 1st round of medicine. Pain is diffuse and similar to prior sickle cell. Pt to get 2nd round of medicine - if he is better, he will be discharged. Based on 0 SIRs, and non focal exam and no fevers - appears to be pain from sickle cell and not any clinically significant complication.    Derwood Kaplan, MD 05/25/15 1940

## 2015-05-23 NOTE — Discharge Instructions (Signed)
Call the sickle cell clinic as requested.   Sickle Cell Anemia, Adult Sickle cell anemia is a condition in which red blood cells have an abnormal "sickle" shape. This abnormal shape shortens the cells' life span, which results in a lower than normal concentration of red blood cells in the blood. The sickle shape also causes the cells to clump together and block free blood flow through the blood vessels. As a result, the tissues and organs of the body do not receive enough oxygen. Sickle cell anemia causes organ damage and pain and increases the risk of infection. CAUSES  Sickle cell anemia is a genetic disorder. Those who receive two copies of the gene have the condition, and those who receive one copy have the trait. RISK FACTORS The sickle cell gene is most common in people whose families originated in [place]. Other areas of the globe where sickle cell trait occurs include the [place], [place] and [place], the [place], and the [place].  SIGNS AND SYMPTOMS  Pain, especially in the extremities, back, chest, or abdomen (common). The pain may start suddenly or may develop following an illness, especially if there is dehydration. Pain can also occur due to overexertion or exposure to extreme temperature changes.  Frequent severe bacterial infections, especially certain types of pneumonia and meningitis.  Pain and swelling in the hands and feet.  Decreased activity.   Loss of appetite.   Change in behavior.  Headaches.  Seizures.  Shortness of breath or difficulty breathing.  Vision changes.  Skin ulcers. Those with the trait may not have symptoms or they may have mild symptoms.  DIAGNOSIS  Sickle cell anemia is diagnosed with blood tests that demonstrate the genetic trait. It is often diagnosed during the newborn period, due to mandatory testing nationwide. A variety of blood tests, X-rays, CT scans, MRI scans, ultrasounds, and lung function tests may also be done  to monitor the condition. TREATMENT  Sickle cell anemia may be treated with:  Medicines. You may be given pain medicines, antibiotic medicines (to treat and prevent infections) or medicines to increase the production of certain types of hemoglobin.  Fluids.  Oxygen.  Blood transfusions. HOME CARE INSTRUCTIONS   Drink enough fluid to keep your urine clear or pale yellow. Increase your fluid intake in hot weather and during exercise.  Do not smoke. Smoking lowers oxygen levels in the blood.   Only take over-the-counter or prescription medicines for pain, fever, or discomfort as directed by your health care provider.  Take antibiotics as directed by your health care provider. Make sure you finish them it even if you start to feel better.   Take supplements as directed by your health care provider.   Consider wearing a medical alert bracelet. This tells anyone caring for you in an emergency of your condition.   When traveling, keep your medical information, health care provider's names, and the medicines you take with you at all times.   If you develop a fever, do not take medicines to reduce the fever right away. This could cover up a problem that is developing. Notify your health care provider.  Keep all follow-up appointments with your health care provider. Sickle cell anemia requires regular medical care. SEEK MEDICAL CARE IF: You have a fever. SEEK IMMEDIATE MEDICAL CARE IF:   You feel dizzy or faint.   You have new abdominal pain, especially on the left side near the stomach area.   You develop a persistent, often uncomfortable and painful penile  erection (priapism). If this is not treated immediately it will lead to impotence.   You have numbness your arms or legs or you have a hard time moving them.   You have a hard time with speech.   You have a fever or persistent symptoms for more than 2-3 days.   You have a fever and your symptoms suddenly get worse.    You have signs or symptoms of infection. These include:   Chills.   Abnormal tiredness (lethargy).   Irritability.   Poor eating.   Vomiting.   You develop pain that is not helped with medicine.   You develop shortness of breath.  You have pain in your chest.   You are coughing up pus-like or bloody sputum.   You develop a stiff neck.  Your feet or hands swell or have pain.  Your abdomen appears bloated.  You develop joint pain. MAKE SURE YOU:  Understand these instructions. Document Released: 11/26/2005 Document Revised: 01/02/2014 Document Reviewed: 03/30/2013 [facility] Patient Information 2015 [place], [place]. This information is not intended to replace advice given to you by your health care provider. Make sure you discuss any questions you have with your health care provider.

## 2015-05-23 NOTE — ED Notes (Signed)
Per pt, sickle cell pain.  Generalized pain.  Started a week ago but not getting any better.

## 2015-07-09 ENCOUNTER — Telehealth: Payer: Self-pay | Admitting: Internal Medicine

## 2015-07-09 NOTE — Telephone Encounter (Signed)
Attempted to contact patient to schedule appointment for primary care. Unable to reach patient. Telephone service disconnected.

## 2016-01-07 IMAGING — CR DG CHEST 2V
2 series · 2 of 2 positions shown · non-contrast
Comparison: None.

CLINICAL DATA: Sickle cell crisis.  Pain.

EXAM:
CHEST  2 VIEW

[w chest pa]
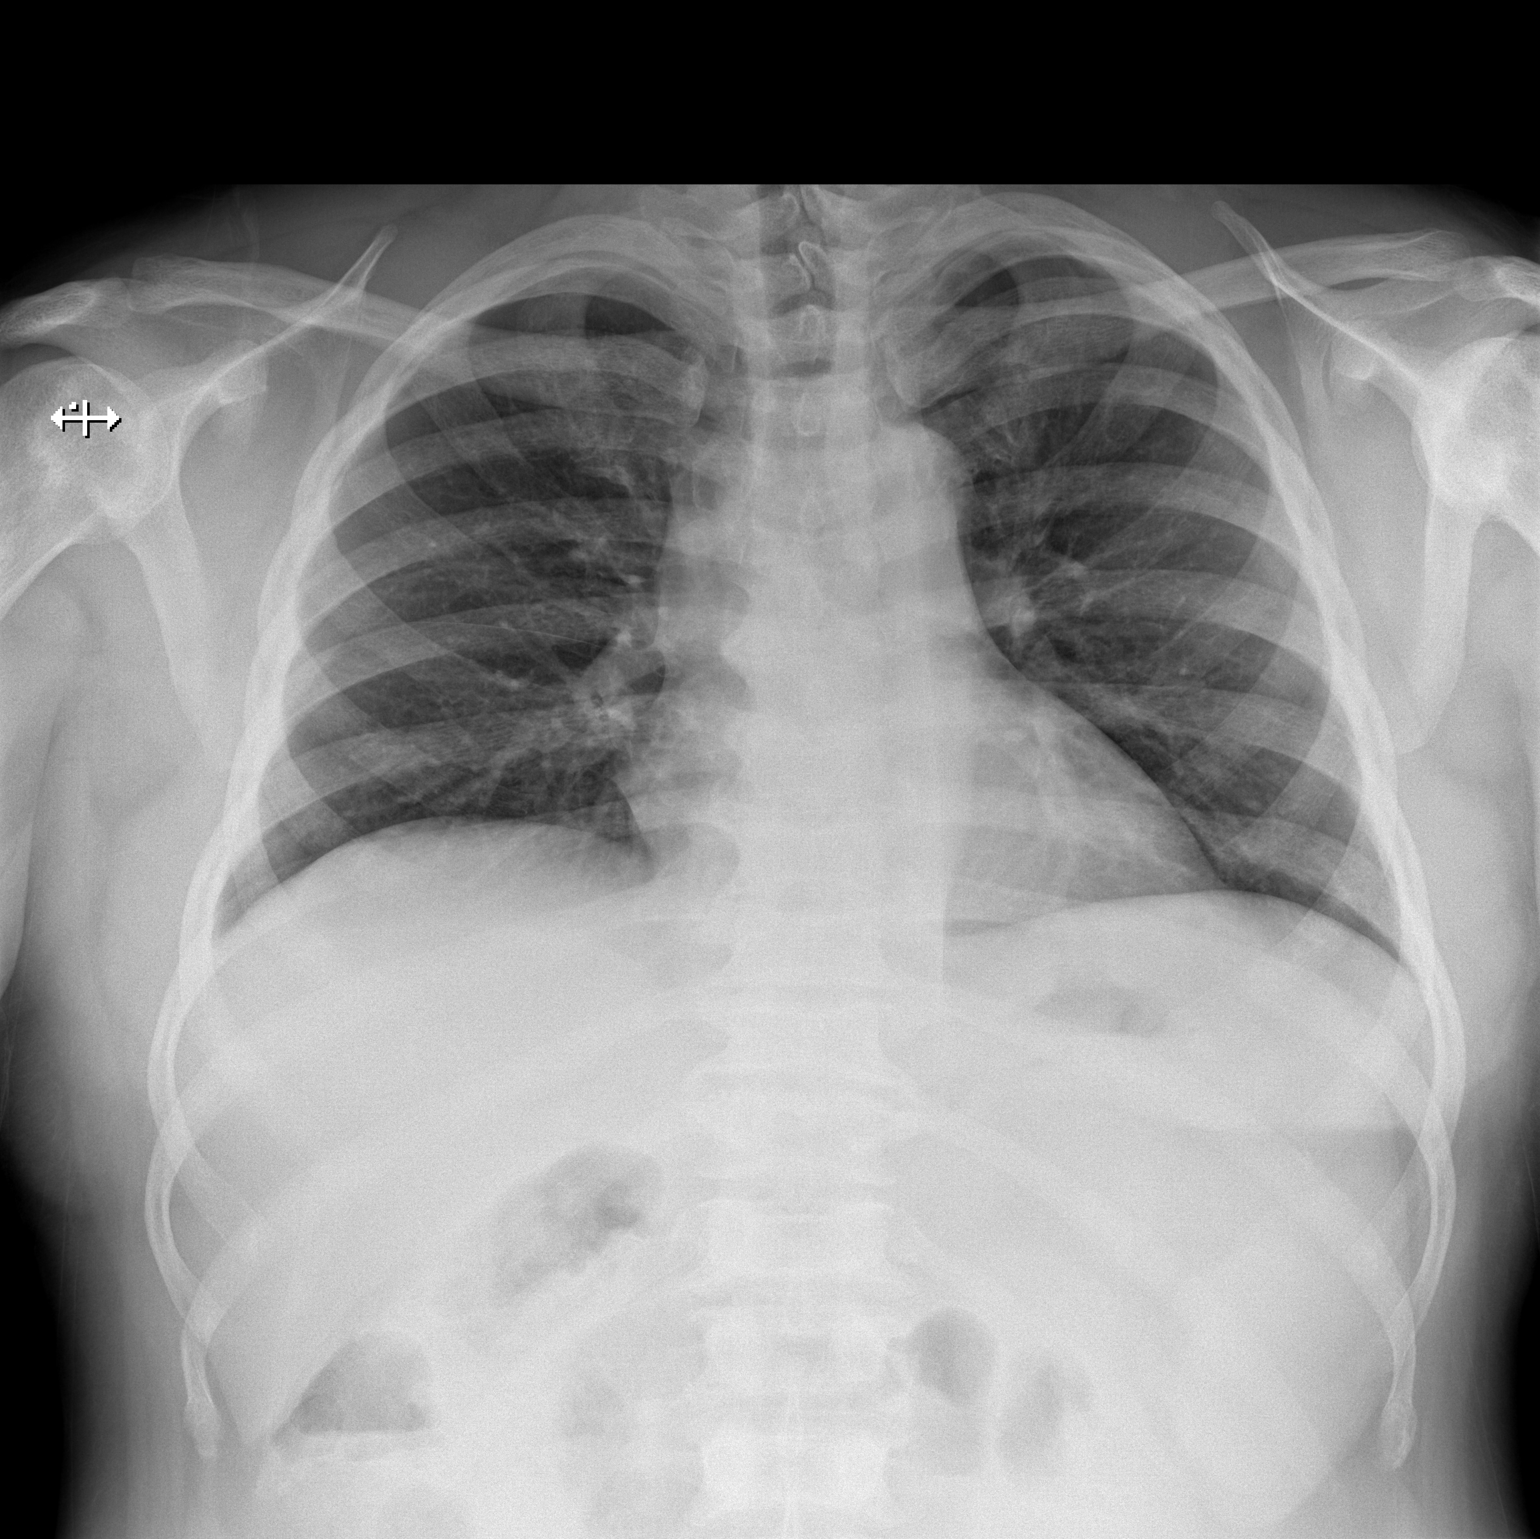

[w chest lat]
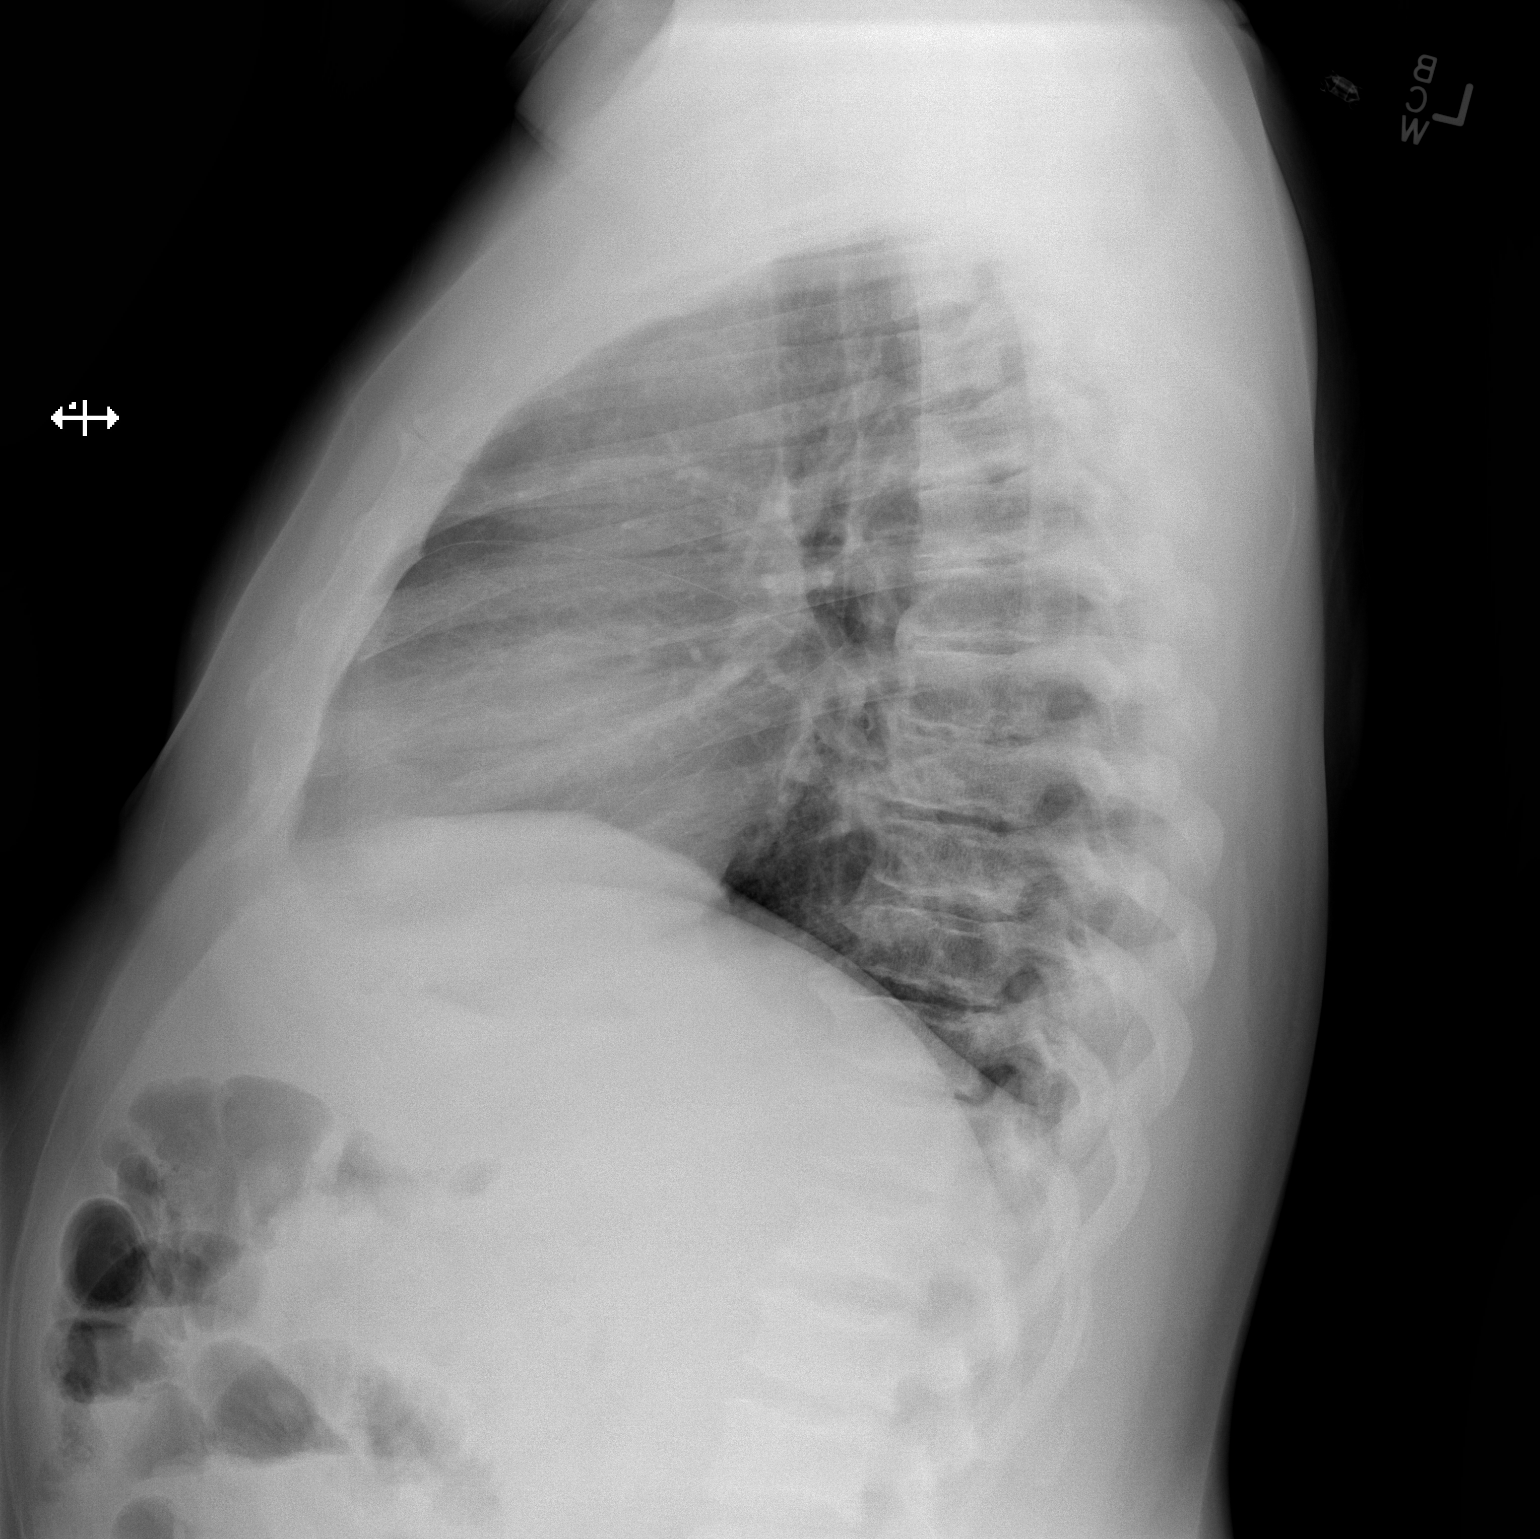

[2 of 2 positions shown; findings below may reference images not displayed]

FINDINGS: Low lung volumes. Normal cardiomediastinal silhouette. No
infiltrates or failure. No effusion or pneumothorax. Vertebral body
morphology and osseous density are consistent with the diagnosis of
sickle cell disease.
IMPRESSION: No active infiltrates.

## 2018-11-01 ENCOUNTER — Emergency Department (HOSPITAL_COMMUNITY)
Admission: EM | Admit: 2018-11-01 | Discharge: 2018-11-01 | Disposition: A | Discharge status: Home / Self Care | Payer: Self-pay | Attending: Emergency Medicine | Admitting: Emergency Medicine

## 2018-11-01 ENCOUNTER — Encounter (HOSPITAL_COMMUNITY): Payer: Self-pay | Admitting: Emergency Medicine

## 2018-11-01 DIAGNOSIS — D5700 Hb-SS disease with crisis, unspecified: Secondary | ICD-10-CM | POA: Insufficient documentation

## 2018-11-01 DIAGNOSIS — Z79899 Other long term (current) drug therapy: Secondary | ICD-10-CM | POA: Insufficient documentation

## 2018-11-01 LAB — CBC WITH DIFFERENTIAL/PLATELET
Abs Immature Granulocytes: 0.1 10*3/uL — ABNORMAL HIGH (ref 0.00–0.07)
Basophils Absolute: 0 10*3/uL (ref 0.0–0.1)
Basophils Relative: 1 %
Eosinophils Absolute: 0.3 10*3/uL (ref 0.0–0.5)
Eosinophils Relative: 4 %
HCT: 29.7 % — ABNORMAL LOW (ref 39.0–52.0)
Hemoglobin: 10.1 g/dL — ABNORMAL LOW (ref 13.0–17.0)
Immature Granulocytes: 2 %
Lymphocytes Relative: 31 %
Lymphs Abs: 2 10*3/uL (ref 0.7–4.0)
MCH: 25.8 pg — ABNORMAL LOW (ref 26.0–34.0)
MCHC: 34 g/dL (ref 30.0–36.0)
MCV: 76 fL — ABNORMAL LOW (ref 80.0–100.0)
Monocytes Absolute: 0.3 10*3/uL (ref 0.1–1.0)
Monocytes Relative: 5 %
Neutro Abs: 3.6 10*3/uL (ref 1.7–7.7)
Neutrophils Relative %: 57 %
Platelets: 105 10*3/uL — ABNORMAL LOW (ref 150–400)
RBC: 3.91 MIL/uL — AB (ref 4.22–5.81)
RDW: 21.4 % — AB (ref 11.5–15.5)
WBC: 6.3 10*3/uL (ref 4.0–10.5)
nRBC: 0.3 % — ABNORMAL HIGH (ref 0.0–0.2)

## 2018-11-01 LAB — BASIC METABOLIC PANEL
ANION GAP: 5 (ref 5–15)
BUN: <5 mg/dL — ABNORMAL LOW (ref 6–20)
CALCIUM: 8.6 mg/dL — AB (ref 8.9–10.3)
CO2: 22 mmol/L (ref 22–32)
Chloride: 113 mmol/L — ABNORMAL HIGH (ref 98–111)
Creatinine, Ser: 0.69 mg/dL (ref 0.61–1.24)
GFR calc Af Amer: >60 mL/min (ref >60)
Glucose, Bld: 104 mg/dL — ABNORMAL HIGH (ref 70–99)
Potassium: 3.8 mmol/L (ref 3.5–5.1)
Sodium: 140 mmol/L (ref 135–145)

## 2018-11-01 LAB — RETICULOCYTES
IMMATURE RETIC FRACT: 38.8 % — AB (ref 2.3–15.9)
RBC.: 3.91 MIL/uL — ABNORMAL LOW (ref 4.22–5.81)
Retic Count, Absolute: 159.1 10*3/uL (ref 19.0–186.0)
Retic Ct Pct: 4.1 % — ABNORMAL HIGH (ref 0.4–3.1)

## 2018-11-01 MED ORDER — SODIUM CHLORIDE 0.45 % IV SOLN
INTRAVENOUS | Status: DC
  Administered 2018-11-01: 09:00:00 via INTRAVENOUS

## 2018-11-01 MED ORDER — HYDROMORPHONE HCL 1 MG/ML IJ SOLN
1.0000 mg | INTRAMUSCULAR | Status: AC
  Administered 2018-11-01 (×2): 1 mg via INTRAVENOUS
  Filled 2018-11-01: qty 1

## 2018-11-01 MED ORDER — HYDROMORPHONE HCL 1 MG/ML IJ SOLN
1.0000 mg | INTRAMUSCULAR | Status: AC
  Filled 2018-11-01: qty 1

## 2018-11-01 MED ORDER — HYDROMORPHONE HCL 1 MG/ML IJ SOLN: 0.5000 mg | INTRAMUSCULAR | Status: AC

## 2018-11-01 MED ORDER — HYDROMORPHONE HCL 1 MG/ML IJ SOLN: 1.0000 mg | INTRAMUSCULAR | Status: AC

## 2018-11-01 MED ORDER — ONDANSETRON HCL 4 MG/2ML IJ SOLN: 4.0000 mg | INTRAMUSCULAR | Status: DC | PRN

## 2018-11-01 MED ORDER — HYDROMORPHONE HCL 1 MG/ML IJ SOLN
0.5000 mg | INTRAMUSCULAR | Status: AC
  Administered 2018-11-01: 0.5 mg via INTRAVENOUS
  Filled 2018-11-01: qty 1

## 2018-11-01 MED ORDER — KETOROLAC TROMETHAMINE 30 MG/ML IJ SOLN
30.0000 mg | INTRAMUSCULAR | Status: AC
  Administered 2018-11-01: 30 mg via INTRAVENOUS
  Filled 2018-11-01: qty 1

## 2018-11-01 NOTE — Discharge Instructions (Addendum)
Please follow up with your sickle cell provider regarding your visit today. Continue taking over-the-counter medications as needed for pain. Return to the ER for chest pain, shortness of breath, fever, or new or concerning symptoms.

## 2018-11-01 NOTE — ED Notes (Signed)
Pt verbalized understanding of discharge paperwork and follow-up care.  °

## 2018-11-01 NOTE — ED Provider Notes (Signed)
[facility] EMERGENCY DEPARTMENT Provider Note   CSN: [id number] Arrival date & time: 11/01/18  8280    History   Chief Complaint Chief Complaint  Patient presents with  . Sickle Cell Pain Crisis    HPI Grant Jones is a 53 y.o. male with past medical history of sickle cell anemia, presenting to the emergency department with complaint of 3 days of low cell pain located to bilateral legs and arms.  Pain is described as his typical sickle cell pain.  Patient states it is been sometime since he has had a crisis.  No obvious triggers, however states the cold weather is been known to cause a crisis.  He is normally able to manage his symptoms at home with over-the-counter medication such as Tylenol, aspirin, ibuprofen.  Last dose of ibuprofen was around 1 AM this morning.  He is not currently followed by sickle cell clinic.  Denies fever, chest pain, shortness of breath, abdominal pain, nausea or vomiting, urinary symptoms.     The history is provided by the patient.    Past Medical History:  Diagnosis Date  . Sickle cell anemia [facility])     Patient Active Problem List   Diagnosis Date Noted  . Sickle cell disease with crisis (HCC) 07/20/2014  . Sickle cell disease, type [place] (HCC) 03/30/2014  . Visit for annual health examination 03/30/2014  . Blindness of left eye 03/30/2014  . Need for Tdap vaccination 03/30/2014  . Special screening for malignant neoplasms, colon 03/30/2014  . Heart murmur 03/30/2014    Past Surgical History:  Procedure Laterality Date  . EYE SURGERY    . HERNIA REPAIR          Home Medications    Prior to Admission medications   Medication Sig Start Date End Date Taking? Authorizing Provider  acetaminophen (TYLENOL) 500 MG tablet Take 1,000 mg by mouth every 6 (six) hours as needed for moderate pain.   Yes [provider]  aspirin 325 MG tablet Take 650 mg by mouth every 8 (eight) hours as needed for mild pain, moderate  pain or headache.   Yes [provider]  cholecalciferol (VITAMIN D) 1000 UNITS tablet Take 1,000 Units by mouth daily.   Yes [provider]  folic acid (FOLVITE) 800 MCG tablet Take 800 mcg by mouth every other day.    Yes [provider]  ibuprofen (ADVIL,MOTRIN) 200 MG tablet Take 800 mg by mouth every 6 (six) hours as needed for moderate pain.   Yes [provider]  OVER THE COUNTER MEDICATION Apply 1 application topically 2 (two) times daily. Hemp cannabis pain cream   Yes [provider]  ergocalciferol (VITAMIN D2) 50000 UNITS capsule Take 1 capsule (50,000 Units total) by mouth once a week. Patient not taking: Reported on 05/23/2015 05/01/14   Massie Maroon, FNP  oxyCODONE-acetaminophen (PERCOCET/ROXICET) 5-325 MG per tablet Take 1 tablet by mouth every 4 (four) hours as needed for severe pain. Patient not taking: Reported on 11/01/2018 05/23/15   Derwood Kaplan, MD    Family History Family History  Problem Relation Age of Onset  . Cancer Mother   . Hypertension Mother   . Cancer Father   . Hypertension Father   . Stroke Father   . Arthritis Father     Social History Social History   Tobacco Use  . Smoking status: Never Smoker  . Smokeless tobacco: Never Used  Substance Use Topics  . Alcohol use: No  .  Drug use: No     Allergies   Penicillins and Codeine   Review of Systems Review of Systems  All other systems reviewed and are negative.    Physical Exam Updated Vital Signs BP 119/67   Pulse 61   Temp 98.3 F (36.8 C) (Oral)   Resp 19   Ht 5\' 7"  (1.702 m)   Wt 97.5 kg   SpO2 98%   BMI 33.67 kg/m   Physical Exam Vitals signs and nursing note reviewed.  Constitutional:      General: He is not in acute distress.    Appearance: He is well-developed. He is not ill-appearing.  HENT:     Head: Normocephalic and atraumatic.  Eyes:     Conjunctiva/sclera: Conjunctivae normal.  Cardiovascular:     Rate and  Rhythm: Normal rate and regular rhythm.  Pulmonary:     Effort: Pulmonary effort is normal. No respiratory distress.     Breath sounds: Normal breath sounds.  Abdominal:     General: Bowel sounds are normal. There is no distension.     Palpations: Abdomen is soft.     Tenderness: There is no abdominal tenderness. There is no guarding.  Musculoskeletal:     Comments: Mild B/l dependent edema. No erythema or warmth. Generalized mild tenderness to knees and ankles.   Skin:    General: Skin is warm.  Neurological:     Mental Status: He is alert.  Psychiatric:        Behavior: Behavior normal.      ED Treatments / Results  Labs (all labs ordered are listed, but only abnormal results are displayed) Labs Reviewed  CBC WITH DIFFERENTIAL/PLATELET - Abnormal; Notable for the following components:      Result Value   RBC 3.91 (*)    Hemoglobin 10.1 (*)    HCT 29.7 (*)    MCV 76.0 (*)    MCH 25.8 (*)    RDW 21.4 (*)    Platelets 105 (*)    nRBC 0.3 (*)    Abs Immature Granulocytes 0.10 (*)    All other components within normal limits  BASIC METABOLIC PANEL - Abnormal; Notable for the following components:   Chloride 113 (*)    Glucose, Bld 104 (*)    BUN <5 (*)    Calcium 8.6 (*)    All other components within normal limits  RETICULOCYTES - Abnormal; Notable for the following components:   Retic Ct Pct 4.1 (*)    RBC. 3.91 (*)    Immature Retic Fract 38.8 (*)    All other components within normal limits    EKG None  Radiology No results found.  Procedures Procedures (including critical care time)  Medications Ordered in ED Medications  0.45 % sodium chloride infusion ( Intravenous New Bag/Given 11/01/18 0856)  HYDROmorphone (DILAUDID) injection 1 mg (1 mg Intravenous Not Given 11/01/18 0945)    Or  HYDROmorphone (DILAUDID) injection 1 mg ( Subcutaneous See Alternative 11/01/18 0945)  HYDROmorphone (DILAUDID) injection 1 mg (1 mg Intravenous Not Given 11/01/18 1030)    Or    HYDROmorphone (DILAUDID) injection 1 mg ( Subcutaneous See Alternative 11/01/18 1030)  ondansetron (ZOFRAN) injection 4 mg (has no administration in time range)  ketorolac (TORADOL) 30 MG/ML injection 30 mg (30 mg Intravenous Given 11/01/18 0947)  HYDROmorphone (DILAUDID) injection 0.5 mg (0.5 mg Intravenous Given 11/01/18 0854)    Or  HYDROmorphone (DILAUDID) injection 0.5 mg ( Subcutaneous See Alternative 11/01/18 0854)  HYDROmorphone (  DILAUDID) injection 1 mg (1 mg Intravenous Given 11/01/18 1229)    Or  HYDROmorphone (DILAUDID) injection 1 mg ( Subcutaneous See Alternative 11/01/18 1229)     Initial Impression / Assessment and Plan / ED Course  I have reviewed the triage vital signs and the nursing notes.  Pertinent labs & imaging results that were available during my care of the patient were reviewed by me and considered in my medical decision making (see chart for details).  Clinical Course as of Nov 01 1334  Mon Nov 01, 2018  1113 Patient reevaluated.  Reports some improvement in symptoms.  RN aware patient needs to be on monitor   [JR]    Clinical Course User Index [JR] Odessa Nishi, [place] N, PA-C       Patient presenting with sickle cell crisis. Patient with typical symptoms and pain. Pt without CP, abdominal pain, or SOB. Pt is not exhibiting signs or symptoms of acute chest syndrome, organ failure,  priapism, or DVT. Pt is afebrile, hemodynamically stable. WBC wnl. Hgb without change from baseline. Patient treated with sickle cell protocol,  with improvement in symptoms. Pt tolerating PO. Pt is safe for discharge with symptomatic management. Strict return precautions discussed.  Discussed results, findings, treatment and follow up. Patient advised of return precautions. Patient verbalized understanding and agreed with plan.  Final Clinical Impressions(s) / ED Diagnoses   Final diagnoses:  Sickle cell anemia with pain [facility](HCC)    ED Discharge Orders    None       Jeriah Corkum,  [place] N, PA-C 11/01/18 1336    Cathren LaineSteinl, Kevin, MD 11/02/18 1625

## 2018-11-01 NOTE — ED Triage Notes (Signed)
Pt arrives with reports of sickle cell pain crisis. Endorses pain in bilateral leg and arm pain. Denies any recent sickness.

## 2023-01-21 DIAGNOSIS — Z008 Encounter for other general examination: Secondary | ICD-10-CM | POA: Diagnosis not present

## 2023-01-21 DIAGNOSIS — Z8249 Family history of ischemic heart disease and other diseases of the circulatory system: Secondary | ICD-10-CM | POA: Diagnosis not present

## 2023-01-21 DIAGNOSIS — D571 Sickle-cell disease without crisis: Secondary | ICD-10-CM | POA: Diagnosis not present

## 2023-01-21 DIAGNOSIS — Z823 Family history of stroke: Secondary | ICD-10-CM | POA: Diagnosis not present

## 2023-01-21 DIAGNOSIS — H269 Unspecified cataract: Secondary | ICD-10-CM | POA: Diagnosis not present

## 2023-01-21 DIAGNOSIS — Z833 Family history of diabetes mellitus: Secondary | ICD-10-CM | POA: Diagnosis not present

## 2023-01-21 DIAGNOSIS — Z885 Allergy status to narcotic agent status: Secondary | ICD-10-CM | POA: Diagnosis not present

## 2023-01-21 DIAGNOSIS — Z6841 Body Mass Index (BMI) 40.0 and over, adult: Secondary | ICD-10-CM | POA: Diagnosis not present

## 2023-01-21 DIAGNOSIS — I10 Essential (primary) hypertension: Secondary | ICD-10-CM | POA: Diagnosis not present
